# Patient Record
Sex: Female | Born: 1975 | Race: White | Hispanic: No | Marital: Married | Smoking: Never smoker
Health system: Southern US, Community
[De-identification: ages and names within clinical notes are randomized; demographics above are authoritative.]

## PROBLEM LIST (undated history)

## (undated) DIAGNOSIS — D649 Anemia, unspecified: Secondary | ICD-10-CM

## (undated) DIAGNOSIS — F53 Postpartum depression: Secondary | ICD-10-CM

## (undated) DIAGNOSIS — Z789 Other specified health status: Secondary | ICD-10-CM

## (undated) DIAGNOSIS — F32A Depression, unspecified: Secondary | ICD-10-CM

## (undated) DIAGNOSIS — R011 Cardiac murmur, unspecified: Secondary | ICD-10-CM

## (undated) DIAGNOSIS — O99345 Other mental disorders complicating the puerperium: Secondary | ICD-10-CM

## (undated) DIAGNOSIS — F329 Major depressive disorder, single episode, unspecified: Secondary | ICD-10-CM

## (undated) HISTORY — DX: Anemia, unspecified: D64.9

## (undated) HISTORY — PX: DILATION AND CURETTAGE OF UTERUS: SHX78

## (undated) HISTORY — PX: MOUTH SURGERY: SHX715

## (undated) HISTORY — DX: Cardiac murmur, unspecified: R01.1

---

## 2003-01-06 ENCOUNTER — Other Ambulatory Visit: Admission: RE | Admit: 2003-01-06 | Discharge: 2003-01-06 | Payer: Self-pay | Admitting: Obstetrics and Gynecology

## 2003-09-07 ENCOUNTER — Encounter: Admission: RE | Admit: 2003-09-07 | Discharge: 2003-09-07 | Payer: Self-pay | Admitting: Internal Medicine

## 2004-01-31 ENCOUNTER — Other Ambulatory Visit: Admission: RE | Admit: 2004-01-31 | Discharge: 2004-01-31 | Payer: Self-pay | Admitting: Obstetrics and Gynecology

## 2005-03-18 ENCOUNTER — Other Ambulatory Visit: Admission: RE | Admit: 2005-03-18 | Discharge: 2005-03-18 | Payer: Self-pay | Admitting: Obstetrics and Gynecology

## 2008-03-25 ENCOUNTER — Ambulatory Visit (HOSPITAL_COMMUNITY): Admission: RE | Admit: 2008-03-25 | Discharge: 2008-03-25 | Payer: Self-pay | Admitting: Obstetrics and Gynecology

## 2008-03-25 ENCOUNTER — Encounter (INDEPENDENT_AMBULATORY_CARE_PROVIDER_SITE_OTHER): Payer: Self-pay | Admitting: Obstetrics and Gynecology

## 2008-06-05 ENCOUNTER — Inpatient Hospital Stay (HOSPITAL_COMMUNITY): Admission: AD | Admit: 2008-06-05 | Discharge: 2008-06-05 | Payer: Self-pay | Admitting: Obstetrics and Gynecology

## 2008-11-07 ENCOUNTER — Ambulatory Visit: Payer: Self-pay | Admitting: Internal Medicine

## 2009-01-10 ENCOUNTER — Ambulatory Visit: Payer: Self-pay | Admitting: Internal Medicine

## 2009-01-30 ENCOUNTER — Ambulatory Visit: Payer: Self-pay | Admitting: Advanced Practice Midwife

## 2009-01-30 ENCOUNTER — Inpatient Hospital Stay (HOSPITAL_COMMUNITY): Admission: AD | Admit: 2009-01-30 | Discharge: 2009-02-01 | Payer: Self-pay | Admitting: Obstetrics and Gynecology

## 2009-02-07 ENCOUNTER — Ambulatory Visit: Admission: RE | Admit: 2009-02-07 | Discharge: 2009-02-07 | Payer: Self-pay | Admitting: Obstetrics and Gynecology

## 2009-11-20 ENCOUNTER — Ambulatory Visit: Payer: Self-pay | Admitting: Internal Medicine

## 2010-10-25 ENCOUNTER — Ambulatory Visit
Admission: RE | Admit: 2010-10-25 | Discharge: 2010-10-25 | Payer: Self-pay | Source: Home / Self Care | Attending: Internal Medicine | Admitting: Internal Medicine

## 2011-02-06 LAB — CBC
HCT: 26.3 % — ABNORMAL LOW (ref 36.0–46.0)
HCT: 37.6 % (ref 36.0–46.0)
Hemoglobin: 12.7 g/dL (ref 12.0–15.0)
Hemoglobin: 9 g/dL — ABNORMAL LOW (ref 12.0–15.0)
MCHC: 33.7 g/dL (ref 30.0–36.0)
MCHC: 34.2 g/dL (ref 30.0–36.0)
MCV: 86.2 fL (ref 78.0–100.0)
MCV: 87.3 fL (ref 78.0–100.0)
Platelets: 177 10*3/uL (ref 150–400)
Platelets: 191 10*3/uL (ref 150–400)
RBC: 3.01 MIL/uL — ABNORMAL LOW (ref 3.87–5.11)
RBC: 4.36 MIL/uL (ref 3.87–5.11)
RDW: 14.2 % (ref 11.5–15.5)
RDW: 14.3 % (ref 11.5–15.5)
WBC: 14.7 10*3/uL — ABNORMAL HIGH (ref 4.0–10.5)
WBC: 16 10*3/uL — ABNORMAL HIGH (ref 4.0–10.5)

## 2011-02-06 LAB — RAPID HIV SCREEN (WH-MAU): Rapid HIV Screen: NONREACTIVE

## 2011-02-06 LAB — RPR: RPR Ser Ql: NONREACTIVE

## 2011-03-12 NOTE — Op Note (Signed)
NAME:  Gloria Howell, Gloria Howell           ACCOUNT NO.:  [id number]   MEDICAL RECORD NO.:  [id number]          PATIENT TYPE:  AMB   LOCATION:  SDC                           FACILITY:  [facility]   PHYSICIAN:  Michelle L. Grewal, M.D.DATE OF BIRTH:  1976-05-28   DATE OF PROCEDURE:  03/25/2008  DATE OF DISCHARGE:                               OPERATIVE REPORT   PREOPERATIVE DIAGNOSIS:  Incomplete intrauterine device.   POSTOPERATIVE DIAGNOSIS:  Incomplete intrauterine device.   PROCEDURE:  Dilatation and evacuation.   SURGEON:  Michelle L. Vincente Poli, MD   ANESTHESIA:  MAC with local.   FINDINGS:  Products of conception.   DISPOSITION:  The specimens to pathology.   ESTIMATED BLOOD LOSS:  Less than 50 mL.   COMPLICATIONS:  None.   PROCEDURE:  The patient was taken to the operating room after informed  consent was obtained.  She was then prepped and draped and in-and-out  catheter was used to empty the bladder.  A speculum was inserted into  the vagina.  The cervix was grasped with a tenaculum, and a paracervical  block was performed.  Of note, there was a moderate amount of blood  noted in the vagina.  The patient had reported, she had started bleeding  the day before.  The cervix was already slightly opened, but I did  dilate a little bit further with a Geneticist, molecular.  A #7 suction cannula  was inserted into the uterus and the uterus was thoroughly suctioned x2  with products of conception obtained.  The cannula was then removed.  A  sharp curette was inserted and the uterus was thoroughly curetted of all  tissue.  A final suction curettage was performed and the uterine cavity  was cleaned.  All instruments were removed from the vagina.  All sponge,  lap, and instrument counts were correct x2.  The patient went to the  recovery room in stable condition.      Michelle L. Vincente Poli, M.D.  Electronically Signed     MLG/MEDQ  D:  03/25/2008  T:  03/25/2008  Job:  [id number]

## 2011-07-24 LAB — CBC
HCT: 36.6
Hemoglobin: 12.7
MCHC: 34.8
MCV: 85.9
Platelets: 265
RBC: 4.26
RDW: 12.6
WBC: 7.8

## 2011-07-24 LAB — ABO/RH: ABO/RH(D): A POS

## 2012-05-07 LAB — OB RESULTS CONSOLE HIV ANTIBODY (ROUTINE TESTING): HIV: NONREACTIVE

## 2012-05-07 LAB — OB RESULTS CONSOLE RPR: RPR: NONREACTIVE

## 2012-05-07 LAB — OB RESULTS CONSOLE RUBELLA ANTIBODY, IGM: Rubella: IMMUNE

## 2012-05-07 LAB — OB RESULTS CONSOLE HEPATITIS B SURFACE ANTIGEN: Hepatitis B Surface Ag: NEGATIVE

## 2012-05-07 LAB — OB RESULTS CONSOLE ANTIBODY SCREEN: Antibody Screen: NEGATIVE

## 2012-05-21 ENCOUNTER — Other Ambulatory Visit: Payer: Self-pay | Admitting: Obstetrics and Gynecology

## 2012-05-21 LAB — OB RESULTS CONSOLE GC/CHLAMYDIA
Chlamydia: NEGATIVE
Gonorrhea: NEGATIVE

## 2012-10-28 NOTE — L&D Delivery Note (Signed)
Operative Delivery Note At 6:45 PM a viable female was delivered via Vaginal, Vacuum Investment banker, operational).  Presentation: vertex; Position: Right,, Occiput,, Anterior; Station: +3.  Verbal consent: obtained from patient.  Risks and benefits discussed in detail.  Risks include, but are not limited to the risks of anesthesia, bleeding, infection, damage to maternal tissues, fetal cephalhematoma.  There is also the risk of inability to effect vaginal delivery of the head, or shoulder dystocia that cannot be resolved by established maneuvers, leading to the need for emergency cesarean section. Mild shoulder dystocia noted relieved with McRoberts maneuver APGAR: 9 9 weight pending   Placenta status: Intact, Spontaneous.   Cord: 3 vessels with the following complications: nuchal x 1  Cord pH: not obtained  Anesthesia: Epidural  Instruments: mushroom Episiotomy: none Lacerations: first  Suture Repair: 3.0 chromic Est. Blood Loss (mL): 300  Mom to postpartum.  Baby to nursery-stable.  Chiquita Heckert L 12/04/2012, 6:55 PM

## 2012-11-19 LAB — OB RESULTS CONSOLE GBS: GBS: NEGATIVE

## 2012-12-04 ENCOUNTER — Inpatient Hospital Stay (HOSPITAL_COMMUNITY)
Admission: AD | Admit: 2012-12-04 | Discharge: 2012-12-07 | DRG: 373 | Disposition: A | Discharge status: Home / Self Care | Payer: BC Managed Care – PPO | Source: Ambulatory Visit | Attending: Obstetrics and Gynecology | Admitting: Obstetrics and Gynecology

## 2012-12-04 ENCOUNTER — Encounter (HOSPITAL_COMMUNITY): Payer: Self-pay | Admitting: Anesthesiology

## 2012-12-04 ENCOUNTER — Encounter (HOSPITAL_COMMUNITY): Payer: Self-pay | Admitting: *Deleted

## 2012-12-04 ENCOUNTER — Inpatient Hospital Stay (HOSPITAL_COMMUNITY): Payer: BC Managed Care – PPO | Admitting: Anesthesiology

## 2012-12-04 DIAGNOSIS — O09529 Supervision of elderly multigravida, unspecified trimester: Secondary | ICD-10-CM | POA: Diagnosis present

## 2012-12-04 HISTORY — DX: Major depressive disorder, single episode, unspecified: F32.9

## 2012-12-04 HISTORY — DX: Other specified health status: Z78.9

## 2012-12-04 HISTORY — DX: Postpartum depression: F53.0

## 2012-12-04 HISTORY — DX: Depression, unspecified: F32.A

## 2012-12-04 HISTORY — DX: Other mental disorders complicating the puerperium: O99.345

## 2012-12-04 LAB — CBC
HCT: 37.3 % (ref 36.0–46.0)
Hemoglobin: 12.6 g/dL (ref 12.0–15.0)
MCH: 28 pg (ref 26.0–34.0)
MCHC: 33.8 g/dL (ref 30.0–36.0)
MCV: 82.9 fL (ref 78.0–100.0)
Platelets: 224 10*3/uL (ref 150–400)
RBC: 4.5 MIL/uL (ref 3.87–5.11)
RDW: 16 % — ABNORMAL HIGH (ref 11.5–15.5)
WBC: 11.5 10*3/uL — ABNORMAL HIGH (ref 4.0–10.5)

## 2012-12-04 LAB — RPR: RPR Ser Ql: NONREACTIVE

## 2012-12-04 MED ORDER — LANOLIN HYDROUS EX OINT: TOPICAL_OINTMENT | CUTANEOUS | Status: DC | PRN

## 2012-12-04 MED ORDER — LACTATED RINGERS IV SOLN
INTRAVENOUS | Status: DC
  Administered 2012-12-04 (×2): 125 mL/h via INTRAVENOUS

## 2012-12-04 MED ORDER — BISACODYL 10 MG RE SUPP: 10.0000 mg | Freq: Every day | RECTAL | Status: DC | PRN

## 2012-12-04 MED ORDER — OXYCODONE-ACETAMINOPHEN 5-325 MG PO TABS: 1.0000 | ORAL_TABLET | ORAL | Status: DC | PRN

## 2012-12-04 MED ORDER — LIDOCAINE HCL (PF) 1 % IJ SOLN
30.0000 mL | INTRAMUSCULAR | Status: DC | PRN
  Administered 2012-12-04: 30 mL via SUBCUTANEOUS
  Filled 2012-12-04: qty 30

## 2012-12-04 MED ORDER — FENTANYL 2.5 MCG/ML BUPIVACAINE 1/10 % EPIDURAL INFUSION (WH - ANES)
INTRAMUSCULAR | Status: DC | PRN
  Administered 2012-12-04: 14 mL/h via EPIDURAL

## 2012-12-04 MED ORDER — MEDROXYPROGESTERONE ACETATE 150 MG/ML IM SUSP: 150.0000 mg | INTRAMUSCULAR | Status: DC | PRN

## 2012-12-04 MED ORDER — ONDANSETRON HCL 4 MG/2ML IJ SOLN: 4.0000 mg | INTRAMUSCULAR | Status: DC | PRN

## 2012-12-04 MED ORDER — ACETAMINOPHEN 325 MG PO TABS: 650.0000 mg | ORAL_TABLET | ORAL | Status: DC | PRN

## 2012-12-04 MED ORDER — DIPHENHYDRAMINE HCL 50 MG/ML IJ SOLN: 12.5000 mg | INTRAMUSCULAR | Status: DC | PRN

## 2012-12-04 MED ORDER — FENTANYL 2.5 MCG/ML BUPIVACAINE 1/10 % EPIDURAL INFUSION (WH - ANES)
14.0000 mL/h | INTRAMUSCULAR | Status: DC
  Filled 2012-12-04: qty 125

## 2012-12-04 MED ORDER — OXYTOCIN 40 UNITS IN LACTATED RINGERS INFUSION - SIMPLE MED
62.5000 mL/h | INTRAVENOUS | Status: DC
  Filled 2012-12-04: qty 1000

## 2012-12-04 MED ORDER — PHENYLEPHRINE 40 MCG/ML (10ML) SYRINGE FOR IV PUSH (FOR BLOOD PRESSURE SUPPORT)
80.0000 ug | PREFILLED_SYRINGE | INTRAVENOUS | Status: DC | PRN
  Filled 2012-12-04: qty 5

## 2012-12-04 MED ORDER — ONDANSETRON HCL 4 MG PO TABS: 4.0000 mg | ORAL_TABLET | ORAL | Status: DC | PRN

## 2012-12-04 MED ORDER — FLEET ENEMA 7-19 GM/118ML RE ENEM: 1.0000 | ENEMA | RECTAL | Status: DC | PRN

## 2012-12-04 MED ORDER — OXYCODONE-ACETAMINOPHEN 5-325 MG PO TABS
1.0000 | ORAL_TABLET | ORAL | Status: DC | PRN
  Administered 2012-12-04: 1 via ORAL
  Filled 2012-12-04: qty 1

## 2012-12-04 MED ORDER — DIPHENHYDRAMINE HCL 25 MG PO CAPS: 25.0000 mg | ORAL_CAPSULE | Freq: Four times a day (QID) | ORAL | Status: DC | PRN

## 2012-12-04 MED ORDER — OXYTOCIN BOLUS FROM INFUSION
500.0000 mL | INTRAVENOUS | Status: DC
  Administered 2012-12-04: 500 mL via INTRAVENOUS

## 2012-12-04 MED ORDER — LACTATED RINGERS IV SOLN: 500.0000 mL | Freq: Once | INTRAVENOUS | Status: DC

## 2012-12-04 MED ORDER — IBUPROFEN 600 MG PO TABS
600.0000 mg | ORAL_TABLET | Freq: Four times a day (QID) | ORAL | Status: DC | PRN
  Administered 2012-12-04: 600 mg via ORAL
  Filled 2012-12-04: qty 1

## 2012-12-04 MED ORDER — EPHEDRINE 5 MG/ML INJ: 10.0000 mg | INTRAVENOUS | Status: DC | PRN

## 2012-12-04 MED ORDER — LIDOCAINE HCL (PF) 1 % IJ SOLN
INTRAMUSCULAR | Status: DC | PRN
  Administered 2012-12-04 (×2): 4 mL

## 2012-12-04 MED ORDER — IBUPROFEN 600 MG PO TABS
600.0000 mg | ORAL_TABLET | Freq: Four times a day (QID) | ORAL | Status: DC
  Administered 2012-12-05 – 2012-12-06 (×7): 600 mg via ORAL
  Filled 2012-12-04 (×9): qty 1

## 2012-12-04 MED ORDER — ZOLPIDEM TARTRATE 5 MG PO TABS: 5.0000 mg | ORAL_TABLET | Freq: Every evening | ORAL | Status: DC | PRN

## 2012-12-04 MED ORDER — SIMETHICONE 80 MG PO CHEW: 80.0000 mg | CHEWABLE_TABLET | ORAL | Status: DC | PRN

## 2012-12-04 MED ORDER — TETANUS-DIPHTH-ACELL PERTUSSIS 5-2.5-18.5 LF-MCG/0.5 IM SUSP: 0.5000 mL | Freq: Once | INTRAMUSCULAR | Status: DC

## 2012-12-04 MED ORDER — BENZOCAINE-MENTHOL 20-0.5 % EX AERO
1.0000 | INHALATION_SPRAY | CUTANEOUS | Status: DC | PRN
Start: 2012-12-04 — End: 2012-12-07
  Administered 2012-12-04: 1 via TOPICAL
  Filled 2012-12-04: qty 56

## 2012-12-04 MED ORDER — LACTATED RINGERS IV SOLN
500.0000 mL | INTRAVENOUS | Status: DC | PRN
  Administered 2012-12-04: 1000 mL via INTRAVENOUS

## 2012-12-04 MED ORDER — ONDANSETRON HCL 4 MG/2ML IJ SOLN: 4.0000 mg | Freq: Four times a day (QID) | INTRAMUSCULAR | Status: DC | PRN

## 2012-12-04 MED ORDER — DIBUCAINE 1 % RE OINT: 1.0000 "application " | TOPICAL_OINTMENT | RECTAL | Status: DC | PRN

## 2012-12-04 MED ORDER — WITCH HAZEL-GLYCERIN EX PADS
1.0000 | MEDICATED_PAD | CUTANEOUS | Status: DC | PRN
Start: 2012-12-04 — End: 2012-12-07

## 2012-12-04 MED ORDER — PRENATAL MULTIVITAMIN CH
1.0000 | ORAL_TABLET | Freq: Every day | ORAL | Status: DC
  Administered 2012-12-05 – 2012-12-07 (×2): 1 via ORAL
  Filled 2012-12-04 (×2): qty 1

## 2012-12-04 MED ORDER — MEASLES, MUMPS & RUBELLA VAC ~~LOC~~ INJ: 0.5000 mL | INJECTION | Freq: Once | SUBCUTANEOUS | Status: DC

## 2012-12-04 MED ORDER — CITRIC ACID-SODIUM CITRATE 334-500 MG/5ML PO SOLN: 30.0000 mL | ORAL | Status: DC | PRN

## 2012-12-04 MED ORDER — FLEET ENEMA 7-19 GM/118ML RE ENEM: 1.0000 | ENEMA | Freq: Every day | RECTAL | Status: DC | PRN

## 2012-12-04 MED ORDER — EPHEDRINE 5 MG/ML INJ
10.0000 mg | INTRAVENOUS | Status: DC | PRN
  Filled 2012-12-04: qty 4

## 2012-12-04 MED ORDER — PHENYLEPHRINE 40 MCG/ML (10ML) SYRINGE FOR IV PUSH (FOR BLOOD PRESSURE SUPPORT): 80.0000 ug | PREFILLED_SYRINGE | INTRAVENOUS | Status: DC | PRN

## 2012-12-04 MED ORDER — SENNOSIDES-DOCUSATE SODIUM 8.6-50 MG PO TABS
2.0000 | ORAL_TABLET | Freq: Every day | ORAL | Status: DC
  Administered 2012-12-04 – 2012-12-05 (×2): 2 via ORAL

## 2012-12-04 NOTE — MAU Note (Signed)
Stripped her membranes this morning when checked in office this morning.  3-4/90 when checked.  Was told to come in and they would break her water and get things going.  Denies any problems with pregnancy.   Not really in any pain.

## 2012-12-04 NOTE — Anesthesia Procedure Notes (Signed)
Epidural Patient location during procedure: OB Start time: 12/04/2012 3:28 PM  Staffing Anesthesiologist: Falisha Osment A. Performed by: anesthesiologist   Preanesthetic Checklist Completed: patient identified, site marked, surgical consent, pre-op evaluation, timeout performed, IV checked, risks and benefits discussed and monitors and equipment checked  Epidural Patient position: sitting Prep: site prepped and draped and DuraPrep Patient monitoring: continuous pulse ox and blood pressure Approach: midline Injection technique: LOR air  Needle:  Needle type: Tuohy  Needle gauge: 17 G Needle length: 9 cm and 9 Needle insertion depth: 5 cm cm Catheter type: closed end flexible Catheter size: 19 Gauge Catheter at skin depth: 10 cm Test dose: negative and Other  Assessment Events: blood not aspirated, injection not painful, no injection resistance, negative IV test and no paresthesia  Additional Notes Patient identified. Risks and benefits discussed including failed block, incomplete  Pain control, post dural puncture headache, nerve damage, paralysis, blood pressure Changes, nausea, vomiting, reactions to medications-both toxic and allergic and post Partum back pain. All questions were answered. Patient expressed understanding and wished to proceed. Sterile technique was used throughout procedure. Epidural site was Dressed with sterile barrier dressing. No paresthesias, signs of intravascular injection Or signs of intrathecal spread were encountered.  Patient was more comfortable after the epidural was dosed. Please see RN's note for documentation of vital signs and FHR which are stable.

## 2012-12-04 NOTE — Progress Notes (Signed)
Dr. Vincente Poli informed of FHR decel down to 60-65, entire decel last 5 minutes, FHR now back up to 130, also informed of interventions & that pt is going to room 160.

## 2012-12-04 NOTE — Progress Notes (Signed)
Informed MD of pt's arrival, states pt to be admitted to L&D.

## 2012-12-04 NOTE — Anesthesia Preprocedure Evaluation (Signed)

## 2012-12-04 NOTE — H&P (Signed)
37 year old G 3 P0111 at 35 w 3 days presented this am to the office in labor 3 to 4 cm dilated. She was then instructed to come to [facility]. Since admission, she was given epidural and now cervix is 9 cm. PNC see [place] History of NSVD Her son has Angelman syndrome.  Afebrile vss FHR Category 1 General alert and oriented Lung CTAB Car rrr Abdomen is soft and non tender Cervix is 100%/9 cm 0  Vertex  AROM Clear fluid  IMPRESSION: IUP at 38 w 3 days Labor  PLAN: Anticipate NSVD

## 2012-12-05 LAB — CBC
HCT: 32 % — ABNORMAL LOW (ref 36.0–46.0)
Hemoglobin: 10.6 g/dL — ABNORMAL LOW (ref 12.0–15.0)
MCH: 27.5 pg (ref 26.0–34.0)
MCHC: 33.1 g/dL (ref 30.0–36.0)
MCV: 83.1 fL (ref 78.0–100.0)
Platelets: 206 10*3/uL (ref 150–400)
RBC: 3.85 MIL/uL — ABNORMAL LOW (ref 3.87–5.11)
RDW: 16.1 % — ABNORMAL HIGH (ref 11.5–15.5)
WBC: 15.2 10*3/uL — ABNORMAL HIGH (ref 4.0–10.5)

## 2012-12-05 MED FILL — Ibuprofen Tab 600 MG: ORAL | Qty: 1 | Status: AC

## 2012-12-05 NOTE — Anesthesia Postprocedure Evaluation (Signed)
Anesthesia Post Note  Patient: Gloria Howell  Procedure(s) Performed: * No procedures listed *  Anesthesia type: Epidural  Patient location: Mother/Baby  Post pain: Pain level controlled  Post assessment: Post-op Vital signs reviewed  Last Vitals:  Filed Vitals:   12/05/12 0540  BP: 117/75  Pulse: 97  Temp: 36.7 C  Resp: 18    Post vital signs: Reviewed  Level of consciousness:alert  Complications: No apparent anesthesia complications

## 2012-12-05 NOTE — Progress Notes (Signed)
Post Partum Day 1 Subjective: no complaints, up ad lib, voiding, tolerating PO and + flatus  Objective: Blood pressure 108/57, pulse 91, temperature 98 F (36.7 C), temperature source Oral, resp. rate 16, height 5\' 3"  (1.6 m), weight 75.297 kg (166 lb), SpO2 99.00%, unknown if currently breastfeeding.  Physical Exam:  General: alert, cooperative and appears stated age Lochia: appropriate Uterine Fundus: firm Incision: healing well DVT Evaluation: No evidence of DVT seen on physical exam.   Recent Labs  12/04/12 1226 12/05/12 0536  HGB 12.6 10.6*  HCT 37.3 32.0*    Assessment/Plan: Plan for discharge tomorrow   LOS: 1 day   Gloria Howell 12/05/2012, 9:22 AM

## 2012-12-06 MED ORDER — LORAZEPAM 1 MG PO TABS
1.0000 mg | ORAL_TABLET | ORAL | Status: DC | PRN
  Administered 2012-12-06 – 2012-12-07 (×3): 1 mg via ORAL
  Filled 2012-12-06 (×3): qty 1

## 2012-12-06 MED ORDER — IBUPROFEN 600 MG PO TABS: 600.0000 mg | ORAL_TABLET | Freq: Four times a day (QID) | ORAL | Status: DC

## 2012-12-06 NOTE — Discharge Summary (Signed)
Obstetric Discharge Summary Reason for Admission: onset of labor Prenatal Procedures: none Intrapartum Procedures: vacuum Postpartum Procedures: none Complications-Operative and Postpartum: none Hemoglobin  Date Value Range Status  12/05/2012 10.6* 12.0 - 15.0 g/dL Final     HCT  Date Value Range Status  12/05/2012 32.0* 36.0 - 46.0 % Final    Physical Exam:  General: alert, cooperative and appears stated age 37: appropriate Uterine Fundus: firm Incision: healing well DVT Evaluation: No evidence of DVT seen on physical exam.  Discharge Diagnoses: Term Pregnancy-delivered  Discharge Information: Date: 12/06/2012 Activity: pelvic rest Diet: routine Medications: Ibuprofen Condition: improved Instructions: refer to practice specific booklet Discharge to: home   Newborn Data: Live born female  Birth Weight: 6 lb 13 oz (3090 g) APGAR: 9, 9  Home with nursery.  Gloria Howell 12/06/2012, 8:18 AM

## 2012-12-06 NOTE — Progress Notes (Signed)
Called by NICU and the nurse Baby had several apneic episodes and Code APGAR called Baby in NICU  Will transfer patient to 3rd floor tonight Ativan prn anxiety

## 2012-12-06 NOTE — Plan of Care (Signed)
Problem: Discharge Progression Outcomes Goal: Complications resolved/controlled Outcome: Adequate for Discharge Infant will become baby patient to further assess nutrition needs, jaundice, and growth.

## 2012-12-06 NOTE — Clinical Social Work Maternal (Signed)
    Clinical Social Work Department PSYCHOSOCIAL ASSESSMENT - MATERNAL/CHILD 12/06/2012  Patient:  Gloria Howell, Gloria Howell  Account Number:  [id number]  Admit Date:  12/04/2012  Marjo Bicker Name:   Rulon Abide    Clinical Social Worker:  Lulu Riding, LCSW   Date/Time:  12/06/2012 10:00 AM  Date Referred:  12/06/2012   Referral source  CN     Referred reason  Behavioral Health Issues   Other referral source:    I:  FAMILY / HOME ENVIRONMENT Child's legal guardian:  PARENT  Guardian - Name Guardian - Age Guardian - Address  Laylah Devivo [address], [place], [place] [address]  Moises Blood  same   Other household support members/support persons Name Relationship DOB  Carter Neal SON 3.5   Other support:   Parents report having a good support system.  They state MGM is caring for their son while they are in the hospital.    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 Avis Epley.   IV  RISK FACTORS AND CURRENT PROBLEMS Current Problem:  None     V  SOCIAL WORK ASSESSMENT CSW met with MOB in her first floor room/142 to complete assessment for hx of PPD.  FOB was present and supportive. Both parents were engaged in the conversation.  They report doing well at this time and having good supports.  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 She states she took Zoloft for a period of time and that it was helpful.  Parents discussed their son's dx of Angelman's Syndrome.  MOB states he was not a good sleeper and she was exhausted.  CSW explained that PPD is common and normal and that talking to her OB was the right thing to do.  She states she has already had a discussion with her OB and knows signs and symptoms to watch for.  Parents state no questions or needs at this time and seemed appreciative of CSW's visit.  CSW has no social concerns at this time.      VI SOCIAL WORK PLAN Social Work Plan  No Further Intervention Required / No Barriers to Discharge   Type of pt/family education:   PPD   If child protective services report - county:   If child protective services report - date:   Information/referral to community resources comment:   No referral needs identified at this time.   Other social work plan:

## 2012-12-06 NOTE — Progress Notes (Signed)
Post Partum Day 2 Subjective: no complaints, up ad lib, voiding and tolerating PO  Objective: Blood pressure 113/74, pulse 84, temperature 97.7 F (36.5 C), temperature source Oral, resp. rate 18, height 5\' 3"  (1.6 m), weight 75.297 kg (166 lb), SpO2 96.00%, unknown if currently breastfeeding.  Physical Exam:  General: alert, cooperative and appears stated age Lochia: appropriate Uterine Fundus: firm Incision: healing well, no significant drainage DVT Evaluation: No evidence of DVT seen on physical exam.   Recent Labs  12/04/12 1226 12/05/12 0536  HGB 12.6 10.6*  HCT 37.3 32.0*    Assessment/Plan: Discharge home and Breastfeeding   LOS: 2 days   Kimari Coudriet L 12/06/2012, 8:17 AM

## 2012-12-07 NOTE — Progress Notes (Signed)
12/07/12 1400  Clinical Encounter Type  Visited With Patient and family together  Visit Type Follow-up   Attempted visit to offer spiritual and emotional support, after colleague Orpha Bur Claussen's initial visit took place at a time inconvenient for family.   Family appreciative of caring outreach.  Gloria Howell was tearful and facing away from the door; I let family know gently that spiritual care is available as desired.  Will follow family in the NICU.  Please page [phone] as chaplain support needed/desired.  Thank you!  [address] [place], [place] [phone]

## 2012-12-07 NOTE — Progress Notes (Signed)
I introduced myself and offered initial support to family.  They did not wish to talk at this time, however they are aware of on-going availability of chaplain support.  We will continue to follow this family in the NICU as we see them, but please also page as needs arise.    [employer] Pager, [phone] 10:37 AM   12/07/12 1000  Clinical Encounter Type  Visited With Patient and family together  Visit Type Initial  Referral From Care management  Stress Factors  Patient Stress Factors (Baby in NICU, unexpected.)

## 2012-12-07 NOTE — Progress Notes (Signed)
Patient has been cooperative, but very tearful and anxious over infants condition. Ativan ordered and given PRN. Spouse remains at bedside. Patient up most of this shift with small intervals of sleeping. Will continue to monitor for changes.

## 2012-12-07 NOTE — Discharge Summary (Signed)
Obstetric Discharge Summary Reason for Admission: onset of labor Prenatal Procedures: none Intrapartum Procedures: vacuum Postpartum Procedures: none Complications-Operative and Postpartum: first degree perineal laceration Hemoglobin  Date Value Range Status  12/05/2012 10.6* 12.0 - 15.0 g/dL Final     HCT  Date Value Range Status  12/05/2012 32.0* 36.0 - 46.0 % Final    Physical Exam:  General: alert Lochia: appropriate Uterine Fundus: firm Incision: healing well DVT Evaluation: No evidence of DVT seen on physical exam.  Discharge Diagnoses: Term Pregnancy-delivered  Discharge Information: Date: 12/07/2012 Activity: pelvic rest Diet: routine Medications: PNV and Percocet Condition: stable Instructions: refer to practice specific booklet Discharge to: home   Newborn Data: Live born female  Birth Weight: 6 lb 13 oz (3090 g) APGAR: 9, 9  Home with in nicu after seizures.  Gloria Howell S 12/07/2012, 8:37 AM

## 2013-01-18 ENCOUNTER — Other Ambulatory Visit: Payer: Self-pay | Admitting: Obstetrics and Gynecology

## 2013-11-29 ENCOUNTER — Ambulatory Visit (INDEPENDENT_AMBULATORY_CARE_PROVIDER_SITE_OTHER): Payer: BC Managed Care – PPO | Admitting: Internal Medicine

## 2013-11-29 ENCOUNTER — Encounter: Payer: Self-pay | Admitting: Internal Medicine

## 2013-11-29 VITALS — BP 128/86 | HR 80 | Temp 99.0°F | Ht 63.75 in | Wt 145.0 lb

## 2013-11-29 DIAGNOSIS — H6690 Otitis media, unspecified, unspecified ear: Secondary | ICD-10-CM

## 2013-11-29 DIAGNOSIS — J029 Acute pharyngitis, unspecified: Secondary | ICD-10-CM

## 2013-11-29 DIAGNOSIS — H6692 Otitis media, unspecified, left ear: Secondary | ICD-10-CM

## 2013-11-29 LAB — POCT RAPID STREP A (OFFICE): Rapid Strep A Screen: NEGATIVE

## 2013-11-29 MED ORDER — AMOXICILLIN-POT CLAVULANATE 875-125 MG PO TABS: 1.0000 | ORAL_TABLET | Freq: Two times a day (BID) | ORAL | Status: DC
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
## 2013-11-29 NOTE — Patient Instructions (Signed)
Take Augmentin 875 mg twice daily for 10 days. Take ibuprofen for pain.

## 2013-12-06 ENCOUNTER — Ambulatory Visit (INDEPENDENT_AMBULATORY_CARE_PROVIDER_SITE_OTHER): Payer: BC Managed Care – PPO | Admitting: Internal Medicine

## 2013-12-06 ENCOUNTER — Telehealth: Payer: Self-pay

## 2013-12-06 ENCOUNTER — Encounter: Payer: Self-pay | Admitting: Internal Medicine

## 2013-12-06 VITALS — BP 134/80 | HR 80 | Temp 99.3°F | Wt 145.0 lb

## 2013-12-06 DIAGNOSIS — H6692 Otitis media, unspecified, left ear: Secondary | ICD-10-CM

## 2013-12-06 DIAGNOSIS — H6690 Otitis media, unspecified, unspecified ear: Secondary | ICD-10-CM

## 2013-12-06 MED ORDER — LEVOFLOXACIN 500 MG PO TABS: 500.0000 mg | ORAL_TABLET | Freq: Every day | ORAL | Status: DC

## 2013-12-06 NOTE — Telephone Encounter (Signed)
Needs Office visit

## 2013-12-06 NOTE — Progress Notes (Signed)
   Subjective:    Patient ID: Gloria Howell, female    DOB: 1976/07/25, 38 y.o.   MRN: [id number]  HPI Patient was seen February 2 for acute left otitis media. Was treated with Augmentin for 10 days. Call last week after a few days saying she was no better. Told her to come in today because she still complaining of decreased hearing and discomfort in left ear. Not hearing well due to ear congestion. Has not been taking decongestant.    Review of Systems     Objective:   Physical Exam Right TM is slightly full but not red. Left TM is fire red dull and retracted       Assessment & Plan:  Acute left otitis media  Plan: Change to Levaquin 500 milligrams daily for 10 days. Take over-the-counter decongestant such as DayQuil.

## 2013-12-06 NOTE — Patient Instructions (Signed)
Discontinue Augmentin and start Levaquin 500 milligrams daily for 10 days. Take DayQuil for ear congestion

## 2013-12-06 NOTE — Telephone Encounter (Signed)
Still having a problem with her left ear. Cant hear out of it.

## 2013-12-06 NOTE — Telephone Encounter (Signed)
Coming today at 4:15pm

## 2014-01-25 ENCOUNTER — Ambulatory Visit (INDEPENDENT_AMBULATORY_CARE_PROVIDER_SITE_OTHER): Payer: BC Managed Care – PPO | Admitting: Internal Medicine

## 2014-01-25 ENCOUNTER — Encounter: Payer: Self-pay | Admitting: Internal Medicine

## 2014-01-25 VITALS — BP 108/80 | Temp 98.9°F | Wt 144.0 lb

## 2014-01-25 DIAGNOSIS — H6692 Otitis media, unspecified, left ear: Secondary | ICD-10-CM

## 2014-01-25 DIAGNOSIS — H6690 Otitis media, unspecified, unspecified ear: Secondary | ICD-10-CM

## 2014-01-25 MED ORDER — LEVOFLOXACIN 500 MG PO TABS: 500.0000 mg | ORAL_TABLET | Freq: Every day | ORAL | Status: DC

## 2014-01-25 NOTE — Patient Instructions (Addendum)
Take Levaquin 500 mg daily x 10 days for ear infection.

## 2014-01-25 NOTE — Progress Notes (Signed)
   Subjective:    Patient ID: Gloria Howell, female    DOB: April 17, 1976, 38 y.o.   MRN: [id number]  HPI Onset malaise and myalgias Friday afternoon. Son has had similar illness with BOM. Has had cough and congestion. Had fever and chills. Had sore throat. A lot of discolored nasal drainage.    Review of Systems     Objective:   Physical Exam Left TM fire red.  Right TM full but not red. Pharynx slightly injected. Neck supple. Chest clear to auscultation. Rapid strep screen is negative.       Assessment & Plan:  LOM Acute URI  Plan: Pt had similar illness in early Feb that did not respond to Augmentin but did respond to levaquin. Treat to day with levaquin 500 mg daily x 10 days.

## 2014-03-18 ENCOUNTER — Other Ambulatory Visit: Payer: Self-pay | Admitting: Obstetrics and Gynecology

## 2014-06-21 ENCOUNTER — Ambulatory Visit (INDEPENDENT_AMBULATORY_CARE_PROVIDER_SITE_OTHER): Payer: BC Managed Care – PPO | Admitting: Internal Medicine

## 2014-06-21 ENCOUNTER — Ambulatory Visit
Admission: RE | Admit: 2014-06-21 | Discharge: 2014-06-21 | Disposition: A | Discharge status: Home / Self Care | Payer: BC Managed Care – PPO | Source: Ambulatory Visit | Attending: Internal Medicine | Admitting: Internal Medicine

## 2014-06-21 ENCOUNTER — Encounter: Payer: Self-pay | Admitting: Internal Medicine

## 2014-06-21 VITALS — BP 120/88 | HR 80 | Temp 98.8°F | Wt 143.0 lb

## 2014-06-21 DIAGNOSIS — R05 Cough: Secondary | ICD-10-CM

## 2014-06-21 DIAGNOSIS — J209 Acute bronchitis, unspecified: Secondary | ICD-10-CM

## 2014-06-21 DIAGNOSIS — R059 Cough, unspecified: Secondary | ICD-10-CM

## 2014-06-21 DIAGNOSIS — D179 Benign lipomatous neoplasm, unspecified: Secondary | ICD-10-CM

## 2014-06-21 DIAGNOSIS — J208 Acute bronchitis due to other specified organisms: Secondary | ICD-10-CM

## 2014-06-21 MED ORDER — LEVOFLOXACIN 500 MG PO TABS: 500.0000 mg | ORAL_TABLET | Freq: Every day | ORAL | Status: DC

## 2014-06-21 MED ORDER — HYDROCODONE-HOMATROPINE 5-1.5 MG/5ML PO SYRP: 5.0000 mL | ORAL_SOLUTION | Freq: Three times a day (TID) | ORAL | Status: DC | PRN

## 2014-06-21 NOTE — Progress Notes (Signed)
Patient informed. 
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
## 2014-06-21 NOTE — Patient Instructions (Addendum)
Take Levaquin 500 mg daily x 10 days. Take cough syrup. Have CXR.

## 2014-08-29 ENCOUNTER — Encounter: Payer: Self-pay | Admitting: Internal Medicine

## 2015-03-28 ENCOUNTER — Encounter: Payer: Self-pay | Admitting: Internal Medicine

## 2015-03-28 ENCOUNTER — Ambulatory Visit (INDEPENDENT_AMBULATORY_CARE_PROVIDER_SITE_OTHER): Payer: BLUE CROSS/BLUE SHIELD | Admitting: Internal Medicine

## 2015-03-28 VITALS — BP 118/82 | HR 72 | Temp 98.0°F | Wt 143.0 lb

## 2015-03-28 DIAGNOSIS — K5792 Diverticulitis of intestine, part unspecified, without perforation or abscess without bleeding: Secondary | ICD-10-CM | POA: Diagnosis not present

## 2015-03-28 MED ORDER — METRONIDAZOLE 500 MG PO TABS: 500.0000 mg | ORAL_TABLET | Freq: Two times a day (BID) | ORAL | Status: DC

## 2015-03-28 MED ORDER — METRONIDAZOLE 500 MG PO TABS: 500.0000 mg | ORAL_TABLET | Freq: Three times a day (TID) | ORAL | Status: DC

## 2015-03-28 MED ORDER — CIPROFLOXACIN HCL 500 MG PO TABS: 500.0000 mg | ORAL_TABLET | Freq: Two times a day (BID) | ORAL | Status: DC

## 2015-03-28 NOTE — Patient Instructions (Addendum)
Advance diet slowly as directed. Call if pain recurs. Take antibiotics directed

## 2015-03-28 NOTE — Progress Notes (Signed)
   Subjective:    Patient ID: Gloria Howell, female    DOB: 1976-10-14, 39 y.o.   MRN: [id number]  HPI  Patient developed lower of the oral cramping in the afternoon Tuesday, May 24 while playing with children. Pain persisted and got worse overnight with significant pain in left lower quadrant. She went to an urgent care on Wednesday, May 25. A CT scan was recommended which cannot be done 12 May 26. White blood cell count was 11,800. Complete metabolic panel was essentially normal except for mild elevation of SGPT of 45. She had taken some over-the-counter pain reliever for abdominal pain. She had no fever or shaking chills. No nausea and vomiting. Just pain localized to the left lower quadrant.  CT scan performed Thursday, May 26 raised possibility of diverticulitis. By Friday she was feeling better. Was recommended to go on clear liquid diet.  Yesterday she ate chicken and rice and has had a lot of borborygmi today. However pain has gotten better.  Says she has a lot of borborygmi  from time to time even prior to having the associated abdominal pain. Never had an attack of diverticulitis. No history of colitis.    Review of Systems     Objective:   Physical Exam  Abdomen seems to be very slightly distended with no hepatosplenomegaly or masses appreciated. There are increased bowel sounds in all 4 quadrants. No significant tenderness and definitely no rebound tenderness.      Assessment & Plan:  Abdominal CT report raises possibility of focal colitis or diverticulitis. I believe she's had acute diverticulitis. I'm treating her with Cipro 500 mg twice daily for 7 days and Flagyl 500 mg twice daily for 7 days. Maintain clear liquid diet for an entire week and then advance to soft diet for 5-7 days and then return to regular diet if tolerated. She is to call if pain recurs

## 2015-03-30 ENCOUNTER — Ambulatory Visit: Payer: Self-pay | Admitting: Internal Medicine

## 2015-04-25 ENCOUNTER — Other Ambulatory Visit: Payer: Self-pay | Admitting: Obstetrics and Gynecology

## 2015-04-26 LAB — CYTOLOGY - PAP

## 2016-02-16 ENCOUNTER — Ambulatory Visit (INDEPENDENT_AMBULATORY_CARE_PROVIDER_SITE_OTHER): Payer: BLUE CROSS/BLUE SHIELD | Admitting: Physician Assistant

## 2016-02-16 VITALS — BP 120/72 | HR 95 | Temp 98.0°F | Resp 16 | Ht 63.75 in | Wt 143.0 lb

## 2016-02-16 DIAGNOSIS — S0101XA Laceration without foreign body of scalp, initial encounter: Secondary | ICD-10-CM | POA: Diagnosis not present

## 2016-02-16 DIAGNOSIS — Z23 Encounter for immunization: Secondary | ICD-10-CM | POA: Diagnosis not present
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
## 2016-02-16 NOTE — Patient Instructions (Addendum)
WOUND CARE Please return in 6-7 days to have your stitches/staples removed or sooner if you have concerns. Marland Kitchen Keep area clean and dry for 24 hours. Do not remove bandage, if applied. . After 24 hours, remove bandage and wash wound gently with mild soap and warm water. Reapply a new bandage after cleaning wound, if directed. . Continue daily cleansing with soap and water until stitches/staples are removed. . Do not apply any ointments or creams to the wound while stitches/staples are in place, as this may cause delayed healing. . Notify the office if you experience any of the following signs of infection: Swelling, redness, pus drainage, streaking, fever >101.0 F . Notify the office if you experience excessive bleeding that does not stop after 15-20 minutes of constant, firm pressure.      IF you received an x-ray today, you will receive an invoice from [facility]. Please contact [facility] at [phone] with questions or concerns regarding your invoice.   IF you received labwork today, you will receive an invoice from [employer]. Please contact [facility] at [phone] with questions or concerns regarding your invoice.   Our billing staff will not be able to assist you with questions regarding bills from these companies.  You will be contacted with the lab results as soon as they are available. The fastest way to get your results is to activate your My Chart account. Instructions are located on the last page of this paperwork. If you have not heard from [place] regarding the results in 2 weeks, please contact this office.

## 2016-02-22 ENCOUNTER — Ambulatory Visit (INDEPENDENT_AMBULATORY_CARE_PROVIDER_SITE_OTHER): Payer: BLUE CROSS/BLUE SHIELD | Admitting: Urgent Care

## 2016-02-22 ENCOUNTER — Encounter: Payer: Self-pay | Admitting: Urgent Care

## 2016-02-22 VITALS — BP 118/80 | HR 90 | Temp 98.0°F | Resp 16 | Ht 63.0 in | Wt 143.0 lb

## 2016-02-22 DIAGNOSIS — S0101XD Laceration without foreign body of scalp, subsequent encounter: Secondary | ICD-10-CM

## 2016-02-22 NOTE — Progress Notes (Signed)
   Patient: Gloria Howell [id number]  Subjective: Electa is returning for suture removal. Patient was initially seen 02/16/2016 and had 3 simple interrupted sutures placed. Admits slight tenderness over her wound. Denies fever, drainage of pus or blood, wound dehiscence, edema.   Objective: Physical Exam  HENT:  Head:      Sutures were not removed.  Assessment and Plan: Well-healing wound, however, I believed that removing the sutures would lead to wound dehiscence. I advised patient rtc on Monday when she is back in town for suture removal. Patient agreed.  Jaynee Eagles, PA-C [facility] [phone] 02/22/2016  8:41 AM

## 2016-02-22 NOTE — Patient Instructions (Signed)
     IF you received an x-ray today, you will receive an invoice from [facility]. Please contact [facility] at [phone] with questions or concerns regarding your invoice.   IF you received labwork today, you will receive an invoice from [facility]. Please contact [facility] at [phone] with questions or concerns regarding your invoice.   Our billing staff will not be able to assist you with questions regarding bills from these companies.  You will be contacted with the lab results as soon as they are available. The fastest way to get your results is to activate your My Chart account. Instructions are located on the last page of this paperwork. If you have not heard from us regarding the results in 2 weeks, please contact this office.      

## 2016-02-26 ENCOUNTER — Ambulatory Visit (INDEPENDENT_AMBULATORY_CARE_PROVIDER_SITE_OTHER): Payer: BLUE CROSS/BLUE SHIELD | Admitting: Physician Assistant

## 2016-02-26 VITALS — BP 124/74 | HR 77 | Temp 98.7°F | Resp 17 | Ht 64.0 in | Wt 144.0 lb

## 2016-02-26 DIAGNOSIS — S0101XD Laceration without foreign body of scalp, subsequent encounter: Secondary | ICD-10-CM

## 2016-02-26 NOTE — Patient Instructions (Signed)
     IF you received an x-ray today, you will receive an invoice from [facility]. Please contact [facility] at [phone] with questions or concerns regarding your invoice.   IF you received labwork today, you will receive an invoice from [facility]. Please contact [facility] at [phone] with questions or concerns regarding your invoice.   Our billing staff will not be able to assist you with questions regarding bills from these companies.  You will be contacted with the lab results as soon as they are available. The fastest way to get your results is to activate your My Chart account. Instructions are located on the last page of this paperwork. If you have not heard from us regarding the results in 2 weeks, please contact this office.      

## 2016-02-26 NOTE — Progress Notes (Signed)
Chief Complaint  Patient presents with  . Suture / Staple Removal    History of Present Illness: Patient presents for suture removal.  Scalp laceration repaired here on 02/16/2016. When she presented for suture removal on 4/27, there was inadequate healing for removal. She was advised to return today for suture removal.  No pain, drainage, swelling. No fever/chills.  She notes that the area is itchy.   No Known Allergies  Prior to Admission medications   Not on File    There are no active problems to display for this patient.    Physical Exam  Constitutional: She is oriented to person, place, and time. She appears well-developed and well-nourished. She is active and cooperative. No distress.  BP 124/74 mmHg  Pulse 77  Temp(Src) 98.7 F (37.1 C) (Oral)  Resp 17  Ht 5\' 4"  (1.626 m)  Wt 144 lb (65.318 kg)  BMI 24.71 kg/m2  SpO2 99%  LMP 02/08/2016 (Approximate)   Eyes: Conjunctivae are normal.  Pulmonary/Chest: Effort normal.  Neurological: She is alert and oriented to person, place, and time.  Skin: Skin is warm and dry. Laceration (well-healed wound on the posterior scalp with #3 Prolene SI sutures. Removed without incident.) noted.  Psychiatric: She has a normal mood and affect. Her speech is normal and behavior is normal.      ASSESSMENT & PLAN:  1. Laceration of scalp, subsequent encounter Healed. Sutures removed. Anticipatory guidance provided.   Fara Chute, PA-C Physician Assistant-Certified [facility]

## 2016-05-20 ENCOUNTER — Ambulatory Visit: Payer: Self-pay | Admitting: General Surgery

## 2016-12-27 DIAGNOSIS — N760 Acute vaginitis: Secondary | ICD-10-CM | POA: Diagnosis not present

## 2016-12-27 DIAGNOSIS — L293 Anogenital pruritus, unspecified: Secondary | ICD-10-CM | POA: Diagnosis not present

## 2017-03-30 ENCOUNTER — Emergency Department (HOSPITAL_COMMUNITY): Payer: BLUE CROSS/BLUE SHIELD

## 2017-03-30 ENCOUNTER — Encounter (HOSPITAL_COMMUNITY): Payer: Self-pay | Admitting: *Deleted

## 2017-03-30 ENCOUNTER — Emergency Department (HOSPITAL_COMMUNITY)
Admission: EM | Admit: 2017-03-30 | Discharge: 2017-03-30 | Disposition: A | Discharge status: Home / Self Care | Payer: BLUE CROSS/BLUE SHIELD | Attending: Emergency Medicine | Admitting: Emergency Medicine

## 2017-03-30 DIAGNOSIS — T148XXA Other injury of unspecified body region, initial encounter: Secondary | ICD-10-CM | POA: Diagnosis not present

## 2017-03-30 DIAGNOSIS — Y999 Unspecified external cause status: Secondary | ICD-10-CM | POA: Insufficient documentation

## 2017-03-30 DIAGNOSIS — S82842A Displaced bimalleolar fracture of left lower leg, initial encounter for closed fracture: Secondary | ICD-10-CM | POA: Diagnosis not present

## 2017-03-30 DIAGNOSIS — S82892A Other fracture of left lower leg, initial encounter for closed fracture: Secondary | ICD-10-CM

## 2017-03-30 DIAGNOSIS — M79605 Pain in left leg: Secondary | ICD-10-CM | POA: Diagnosis not present

## 2017-03-30 DIAGNOSIS — S99912A Unspecified injury of left ankle, initial encounter: Secondary | ICD-10-CM | POA: Diagnosis present

## 2017-03-30 DIAGNOSIS — Y9234 Swimming pool (public) as the place of occurrence of the external cause: Secondary | ICD-10-CM | POA: Insufficient documentation

## 2017-03-30 DIAGNOSIS — W010XXA Fall on same level from slipping, tripping and stumbling without subsequent striking against object, initial encounter: Secondary | ICD-10-CM | POA: Insufficient documentation

## 2017-03-30 DIAGNOSIS — Y9389 Activity, other specified: Secondary | ICD-10-CM | POA: Diagnosis not present

## 2017-03-30 DIAGNOSIS — S8252XA Displaced fracture of medial malleolus of left tibia, initial encounter for closed fracture: Secondary | ICD-10-CM | POA: Insufficient documentation

## 2017-03-30 MED ORDER — FENTANYL CITRATE (PF) 100 MCG/2ML IJ SOLN
INTRAMUSCULAR | Status: AC
  Filled 2017-03-30: qty 2

## 2017-03-30 MED ORDER — FENTANYL CITRATE (PF) 100 MCG/2ML IJ SOLN
50.0000 ug | INTRAMUSCULAR | Status: DC | PRN
  Administered 2017-03-30: 50 ug via INTRAVENOUS
  Filled 2017-03-30: qty 2

## 2017-03-30 MED ORDER — MELOXICAM 15 MG PO TABS: 15.0000 mg | ORAL_TABLET | Freq: Every day | ORAL | 0 refills | Status: DC

## 2017-03-30 MED ORDER — FENTANYL CITRATE (PF) 100 MCG/2ML IJ SOLN
100.0000 ug | Freq: Once | INTRAMUSCULAR | Status: AC
Start: 2017-03-30 — End: 2017-03-30
  Administered 2017-03-30: 100 ug via INTRAVENOUS

## 2017-03-30 MED ORDER — HYDROCODONE-ACETAMINOPHEN 5-325 MG PO TABS: 1.0000 | ORAL_TABLET | Freq: Four times a day (QID) | ORAL | 0 refills | Status: DC | PRN

## 2017-03-30 NOTE — ED Notes (Signed)
ICE GIVEN AND ELEVATED TO LLE

## 2017-03-30 NOTE — ED Notes (Signed)
Bed: UP10 Expected date:  Expected time:  Means of arrival:  Comments: 41 yo L ankle injury- fentanyl given

## 2017-03-30 NOTE — ED Notes (Signed)
ORTHO TECH JON PRESENT EDUCATING FAMILY AND PT

## 2017-03-30 NOTE — ED Notes (Signed)
Splint with ace to LLE WITH GOOD CMS. EDUCATED ON REASONS TO RETURN TO EMERGENCY ROOM.
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
## 2017-03-30 NOTE — Discharge Instructions (Signed)
Contact a health care provider if: You develop increased swelling or discomfort. Get help right away if: Your cast gets damaged or breaks. You have continued severe pain. You develop new pain or swelling after the cast was put on. Your skin or toenails below the injury turn blue or gray. Your skin or toenails below the injury feel cold, numb, or have loss of sensitivity to touch. There is a bad smell or pus draining from under the cast.
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
## 2017-03-31 DIAGNOSIS — S82842A Displaced bimalleolar fracture of left lower leg, initial encounter for closed fracture: Secondary | ICD-10-CM | POA: Diagnosis not present

## 2017-04-08 DIAGNOSIS — G8918 Other acute postprocedural pain: Secondary | ICD-10-CM | POA: Diagnosis not present

## 2017-04-08 DIAGNOSIS — X58XXXA Exposure to other specified factors, initial encounter: Secondary | ICD-10-CM | POA: Diagnosis not present

## 2017-04-08 DIAGNOSIS — Y999 Unspecified external cause status: Secondary | ICD-10-CM | POA: Diagnosis not present

## 2017-04-08 DIAGNOSIS — S82842A Displaced bimalleolar fracture of left lower leg, initial encounter for closed fracture: Secondary | ICD-10-CM | POA: Diagnosis not present

## 2017-04-25 DIAGNOSIS — S82842D Displaced bimalleolar fracture of left lower leg, subsequent encounter for closed fracture with routine healing: Secondary | ICD-10-CM | POA: Diagnosis not present

## 2017-05-09 DIAGNOSIS — R87610 Atypical squamous cells of undetermined significance on cytologic smear of cervix (ASC-US): Secondary | ICD-10-CM | POA: Insufficient documentation

## 2017-05-09 DIAGNOSIS — Z6826 Body mass index (BMI) 26.0-26.9, adult: Secondary | ICD-10-CM | POA: Diagnosis not present

## 2017-05-09 DIAGNOSIS — Z1212 Encounter for screening for malignant neoplasm of rectum: Secondary | ICD-10-CM | POA: Diagnosis not present

## 2017-05-09 DIAGNOSIS — Z01419 Encounter for gynecological examination (general) (routine) without abnormal findings: Secondary | ICD-10-CM | POA: Diagnosis not present

## 2017-05-16 DIAGNOSIS — Z1231 Encounter for screening mammogram for malignant neoplasm of breast: Secondary | ICD-10-CM | POA: Diagnosis not present

## 2017-05-23 DIAGNOSIS — S82842D Displaced bimalleolar fracture of left lower leg, subsequent encounter for closed fracture with routine healing: Secondary | ICD-10-CM | POA: Diagnosis not present

## 2017-05-30 DIAGNOSIS — S82842D Displaced bimalleolar fracture of left lower leg, subsequent encounter for closed fracture with routine healing: Secondary | ICD-10-CM | POA: Diagnosis not present

## 2017-06-02 DIAGNOSIS — S82842D Displaced bimalleolar fracture of left lower leg, subsequent encounter for closed fracture with routine healing: Secondary | ICD-10-CM | POA: Diagnosis not present

## 2017-06-02 DIAGNOSIS — R87610 Atypical squamous cells of undetermined significance on cytologic smear of cervix (ASC-US): Secondary | ICD-10-CM | POA: Diagnosis not present

## 2017-06-02 DIAGNOSIS — R87810 Cervical high risk human papillomavirus (HPV) DNA test positive: Secondary | ICD-10-CM | POA: Diagnosis not present

## 2017-06-05 DIAGNOSIS — S82842D Displaced bimalleolar fracture of left lower leg, subsequent encounter for closed fracture with routine healing: Secondary | ICD-10-CM | POA: Diagnosis not present

## 2017-06-09 DIAGNOSIS — S82842D Displaced bimalleolar fracture of left lower leg, subsequent encounter for closed fracture with routine healing: Secondary | ICD-10-CM | POA: Diagnosis not present

## 2017-06-13 DIAGNOSIS — S82842A Displaced bimalleolar fracture of left lower leg, initial encounter for closed fracture: Secondary | ICD-10-CM | POA: Diagnosis not present

## 2017-06-16 DIAGNOSIS — S82842D Displaced bimalleolar fracture of left lower leg, subsequent encounter for closed fracture with routine healing: Secondary | ICD-10-CM | POA: Diagnosis not present

## 2017-06-20 DIAGNOSIS — S82842D Displaced bimalleolar fracture of left lower leg, subsequent encounter for closed fracture with routine healing: Secondary | ICD-10-CM | POA: Diagnosis not present

## 2017-06-23 DIAGNOSIS — S82842D Displaced bimalleolar fracture of left lower leg, subsequent encounter for closed fracture with routine healing: Secondary | ICD-10-CM | POA: Diagnosis not present

## 2017-06-24 DIAGNOSIS — S82842D Displaced bimalleolar fracture of left lower leg, subsequent encounter for closed fracture with routine healing: Secondary | ICD-10-CM | POA: Diagnosis not present

## 2017-06-27 DIAGNOSIS — S82842D Displaced bimalleolar fracture of left lower leg, subsequent encounter for closed fracture with routine healing: Secondary | ICD-10-CM | POA: Diagnosis not present

## 2017-07-07 DIAGNOSIS — S82842D Displaced bimalleolar fracture of left lower leg, subsequent encounter for closed fracture with routine healing: Secondary | ICD-10-CM | POA: Diagnosis not present

## 2017-07-11 DIAGNOSIS — S82842D Displaced bimalleolar fracture of left lower leg, subsequent encounter for closed fracture with routine healing: Secondary | ICD-10-CM | POA: Diagnosis not present

## 2017-07-14 DIAGNOSIS — S82842D Displaced bimalleolar fracture of left lower leg, subsequent encounter for closed fracture with routine healing: Secondary | ICD-10-CM | POA: Diagnosis not present

## 2017-07-18 DIAGNOSIS — S82842D Displaced bimalleolar fracture of left lower leg, subsequent encounter for closed fracture with routine healing: Secondary | ICD-10-CM | POA: Diagnosis not present

## 2017-07-21 DIAGNOSIS — S82842D Displaced bimalleolar fracture of left lower leg, subsequent encounter for closed fracture with routine healing: Secondary | ICD-10-CM | POA: Diagnosis not present

## 2017-07-24 DIAGNOSIS — S82842D Displaced bimalleolar fracture of left lower leg, subsequent encounter for closed fracture with routine healing: Secondary | ICD-10-CM | POA: Diagnosis not present

## 2017-07-25 DIAGNOSIS — S82842D Displaced bimalleolar fracture of left lower leg, subsequent encounter for closed fracture with routine healing: Secondary | ICD-10-CM | POA: Diagnosis not present

## 2017-07-25 DIAGNOSIS — Z09 Encounter for follow-up examination after completed treatment for conditions other than malignant neoplasm: Secondary | ICD-10-CM | POA: Diagnosis not present

## 2017-08-01 DIAGNOSIS — S82842D Displaced bimalleolar fracture of left lower leg, subsequent encounter for closed fracture with routine healing: Secondary | ICD-10-CM | POA: Diagnosis not present

## 2017-08-08 DIAGNOSIS — S82842D Displaced bimalleolar fracture of left lower leg, subsequent encounter for closed fracture with routine healing: Secondary | ICD-10-CM | POA: Diagnosis not present

## 2017-08-15 DIAGNOSIS — S82842D Displaced bimalleolar fracture of left lower leg, subsequent encounter for closed fracture with routine healing: Secondary | ICD-10-CM | POA: Diagnosis not present

## 2017-08-22 DIAGNOSIS — S82842D Displaced bimalleolar fracture of left lower leg, subsequent encounter for closed fracture with routine healing: Secondary | ICD-10-CM | POA: Diagnosis not present

## 2017-09-12 DIAGNOSIS — Z09 Encounter for follow-up examination after completed treatment for conditions other than malignant neoplasm: Secondary | ICD-10-CM | POA: Diagnosis not present

## 2017-09-12 DIAGNOSIS — T8484XA Pain due to internal orthopedic prosthetic devices, implants and grafts, initial encounter: Secondary | ICD-10-CM | POA: Diagnosis not present

## 2017-09-16 DIAGNOSIS — T8484XA Pain due to internal orthopedic prosthetic devices, implants and grafts, initial encounter: Secondary | ICD-10-CM | POA: Diagnosis not present

## 2017-11-17 DIAGNOSIS — H5201 Hypermetropia, right eye: Secondary | ICD-10-CM | POA: Diagnosis not present

## 2017-12-08 DIAGNOSIS — R87610 Atypical squamous cells of undetermined significance on cytologic smear of cervix (ASC-US): Secondary | ICD-10-CM | POA: Diagnosis not present

## 2018-04-03 ENCOUNTER — Ambulatory Visit (INDEPENDENT_AMBULATORY_CARE_PROVIDER_SITE_OTHER): Payer: BLUE CROSS/BLUE SHIELD | Admitting: Internal Medicine

## 2018-04-03 ENCOUNTER — Encounter: Payer: Self-pay | Admitting: Internal Medicine

## 2018-04-03 VITALS — BP 110/80 | HR 89 | Temp 98.9°F | Ht 63.0 in | Wt 146.0 lb

## 2018-04-03 DIAGNOSIS — J069 Acute upper respiratory infection, unspecified: Secondary | ICD-10-CM

## 2018-04-03 DIAGNOSIS — H8110 Benign paroxysmal vertigo, unspecified ear: Secondary | ICD-10-CM | POA: Diagnosis not present

## 2018-04-03 DIAGNOSIS — R42 Dizziness and giddiness: Secondary | ICD-10-CM | POA: Diagnosis not present

## 2018-04-03 MED ORDER — FLUOCINONIDE 0.05 % EX SOLN: CUTANEOUS | 0 refills | Status: DC

## 2018-04-03 MED ORDER — METHYLPREDNISOLONE ACETATE 80 MG/ML IJ SUSP
80.0000 mg | Freq: Once | INTRAMUSCULAR | Status: AC
  Administered 2018-04-03: 80 mg via INTRAMUSCULAR

## 2018-04-03 MED ORDER — AZITHROMYCIN 250 MG PO TABS: ORAL_TABLET | ORAL | 0 refills | Status: DC

## 2018-04-03 NOTE — Progress Notes (Signed)
   Subjective:    Patient ID: Gloria Howell, female    DOB: [birth year], 42 y.o.   MRN: [id number]  HPI 42 year old Female with "head cold" around May 21st treated with tele-visit with Amoxicillin by another provider. We do not have those records in [place].Has had itching in right ear for someone time. Ears have not gotten better with Amoxicillin. Still feel ear congestion. Vertigo started even prior to head cold. Working at [employer] 3 days a week.  Not seen here since 2016.      Review of Systems see above     Objective:   Physical Exam Skin warm and dry.  Nodes none.  PERRLA.  Extraocular movements are full.  TMs are clear.  Pharynx is clear.  Neck is supple.  Chest clear.  No significant nystagmus demonstrated.       Assessment & Plan:  Benign positional vertigo  Protracted respiratory infection  Plan: Depo-Medrol 80 mg IM.  Zithromax Z-Pak take 2 tablets day 1 followed by 1 tablet days 2 through 5.  May take meclizine for dizziness.  Purchase this over-the-counter.

## 2018-04-09 DIAGNOSIS — L089 Local infection of the skin and subcutaneous tissue, unspecified: Secondary | ICD-10-CM | POA: Diagnosis not present

## 2018-04-09 DIAGNOSIS — D171 Benign lipomatous neoplasm of skin and subcutaneous tissue of trunk: Secondary | ICD-10-CM | POA: Diagnosis not present

## 2018-04-14 ENCOUNTER — Ambulatory Visit: Payer: Self-pay | Admitting: General Surgery

## 2018-04-17 DIAGNOSIS — L089 Local infection of the skin and subcutaneous tissue, unspecified: Secondary | ICD-10-CM | POA: Diagnosis not present

## 2018-04-17 DIAGNOSIS — L723 Sebaceous cyst: Secondary | ICD-10-CM | POA: Diagnosis not present

## 2018-04-20 ENCOUNTER — Telehealth: Payer: Self-pay | Admitting: Internal Medicine

## 2018-04-20 NOTE — Telephone Encounter (Signed)
Unfortunately we do not have any records from the procedure. I have called her and left a voice mail. I think she should ask for an appointment to see the surgeon. If that is not possible, she could consider a plastic surgery appointment.

## 2018-04-20 NOTE — Telephone Encounter (Signed)
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  She decided to see a Psychologist, sport and exercise about it.  She was seen in consult for it and the PA decided it excise it to see if he could get some of the drainage out of it.  She now has this hole in her back and it had to be packed.  She said she like the PA and the surgery was scheduled with Dr. Cherlyn Roberts for 8/15, but she didn't get to see Dr. Cherlyn Roberts.    She was told that she would need to pack this hole in her back and change the packing every day for the next week to 10 days.  She said that she cannot reach the area on her lower back to do this packing and that she is not up to doing this because #1, it is painful to do so, #2, she cannot reach the area to do the packing and cleaning of the area, #3, she needs to enlist the help of family in order to do the packing and cleaning of the area.  She called the Surgeon's office back and they continue to send her to the Nurse Triage area and they said they would sign her up to come in and have a nurse do this for her each day.    She said at this point, she is beginning to wonder if she should have a second opinion before having this surgery on her back.  She said she DID like the PA who did this to her back, however, she is just anxious now about the entire ordeal.  She has had this place on her back for 20+ years and she just went to have a consult and now she has this hole in her back and requires packing and a change of a wound every day.    He put her on Doxycycline x 7 days.  Now she is on 5 more days of Doxycycline and she feels like the drainage now that she probably has MRSA.  She really wanted to get your advice and see if you feel that she should get a 2nd opinion and if so, who would you recommend?

## 2018-04-23 NOTE — Telephone Encounter (Signed)
Patient called states she was able to get in with the general surgeon Dr. Kieth Brightly, they have arranged for the nurse to see her weekly to pack the wound and she just wanted to thank you for the referral.

## 2018-04-26 NOTE — Patient Instructions (Signed)
May take meclizine for dizziness.  Depo-Medrol 80 mg IM.  Take Zithromax Z-PAK 2 p.o. day 1 followed by 1 p.o. days 2 through 5.

## 2018-04-29 DIAGNOSIS — L723 Sebaceous cyst: Secondary | ICD-10-CM | POA: Diagnosis not present

## 2018-04-29 DIAGNOSIS — L089 Local infection of the skin and subcutaneous tissue, unspecified: Secondary | ICD-10-CM | POA: Diagnosis not present

## 2018-05-28 DIAGNOSIS — L720 Epidermal cyst: Secondary | ICD-10-CM | POA: Diagnosis not present

## 2018-05-28 DIAGNOSIS — L718 Other rosacea: Secondary | ICD-10-CM | POA: Diagnosis not present

## 2018-06-04 DIAGNOSIS — Z6825 Body mass index (BMI) 25.0-25.9, adult: Secondary | ICD-10-CM | POA: Diagnosis not present

## 2018-06-04 DIAGNOSIS — Z1212 Encounter for screening for malignant neoplasm of rectum: Secondary | ICD-10-CM | POA: Diagnosis not present

## 2018-06-04 DIAGNOSIS — Z01419 Encounter for gynecological examination (general) (routine) without abnormal findings: Secondary | ICD-10-CM | POA: Diagnosis not present

## 2018-06-04 DIAGNOSIS — Z1231 Encounter for screening mammogram for malignant neoplasm of breast: Secondary | ICD-10-CM | POA: Diagnosis not present

## 2018-06-18 ENCOUNTER — Ambulatory Visit: Payer: Self-pay | Admitting: General Surgery

## 2018-07-15 ENCOUNTER — Ambulatory Visit (HOSPITAL_BASED_OUTPATIENT_CLINIC_OR_DEPARTMENT_OTHER): Admit: 2018-07-15 | Payer: BLUE CROSS/BLUE SHIELD | Admitting: General Surgery

## 2018-07-15 ENCOUNTER — Encounter (HOSPITAL_BASED_OUTPATIENT_CLINIC_OR_DEPARTMENT_OTHER): Payer: Self-pay

## 2018-07-15 SURGERY — EXCISION MASS
Anesthesia: Choice

## 2018-10-05 ENCOUNTER — Ambulatory Visit
Admission: RE | Admit: 2018-10-05 | Discharge: 2018-10-05 | Disposition: A | Discharge status: Home / Self Care | Payer: BLUE CROSS/BLUE SHIELD | Source: Ambulatory Visit | Attending: Internal Medicine | Admitting: Internal Medicine

## 2018-10-05 ENCOUNTER — Ambulatory Visit (INDEPENDENT_AMBULATORY_CARE_PROVIDER_SITE_OTHER): Payer: BLUE CROSS/BLUE SHIELD | Admitting: Internal Medicine

## 2018-10-05 ENCOUNTER — Encounter: Payer: Self-pay | Admitting: Internal Medicine

## 2018-10-05 VITALS — BP 160/100 | HR 107 | Ht 63.0 in | Wt 150.0 lb

## 2018-10-05 DIAGNOSIS — S0992XA Unspecified injury of nose, initial encounter: Secondary | ICD-10-CM | POA: Diagnosis not present

## 2018-10-05 DIAGNOSIS — W19XXXA Unspecified fall, initial encounter: Secondary | ICD-10-CM | POA: Diagnosis not present

## 2018-10-05 DIAGNOSIS — S0033XA Contusion of nose, initial encounter: Secondary | ICD-10-CM | POA: Diagnosis not present

## 2018-10-05 DIAGNOSIS — R220 Localized swelling, mass and lump, head: Secondary | ICD-10-CM | POA: Diagnosis not present

## 2018-10-05 NOTE — Progress Notes (Signed)
   Subjective:    Patient ID: Lendon Ka Herrera, female    DOB: [birth year], 42 y.o.   MRN: [id number]  HPI 42 year old Female in today with injury to her nose area.  She fell from her bed accidentally on December 6.  Had no loss of consciousness but wound up with swelling and pain of nose.  Is concerned and would like to have it checked today.  Reported infraorbital contusions and soft tissue swelling of her nose after the accident.    Review of Systems no other complaints     Objective:   Physical Exam  She has swelling of her nose.  Extraocular movements are full.  PERRLA.  Brief neurological exam no focal deficits.  X-ray of nasal bones shows no evidence of fracture.  On exam today, no evidence of infraorbital contusions      Assessment & Plan:  Nasal contusion secondary to fall  Plan: Patient reassured

## 2018-10-26 NOTE — Patient Instructions (Addendum)
Patient reassured there is no evidence of nasal fracture.  Swelling should subside in the next couple of weeks

## 2018-12-20 ENCOUNTER — Telehealth: Payer: Self-pay | Admitting: Internal Medicine

## 2018-12-20 ENCOUNTER — Encounter: Payer: Self-pay | Admitting: Internal Medicine

## 2018-12-20 MED ORDER — OSELTAMIVIR PHOSPHATE 75 MG PO CAPS: 75.0000 mg | ORAL_CAPSULE | Freq: Every day | ORAL | 0 refills | Status: DC

## 2018-12-20 NOTE — Telephone Encounter (Signed)
Exposure to flu in son and husband. Son tested positive for Influenza A yesterday and husband has symptoms. Call in Tamiflu 75 mg daily x 5 days

## 2019-02-15 ENCOUNTER — Telehealth: Payer: Self-pay

## 2019-02-15 ENCOUNTER — Ambulatory Visit (INDEPENDENT_AMBULATORY_CARE_PROVIDER_SITE_OTHER): Payer: BLUE CROSS/BLUE SHIELD | Admitting: Internal Medicine

## 2019-02-15 ENCOUNTER — Encounter: Payer: Self-pay | Admitting: Internal Medicine

## 2019-02-15 ENCOUNTER — Other Ambulatory Visit: Payer: Self-pay

## 2019-02-15 DIAGNOSIS — L03012 Cellulitis of left finger: Secondary | ICD-10-CM

## 2019-02-15 NOTE — Telephone Encounter (Signed)
Schedule virtual visit 

## 2019-02-15 NOTE — Telephone Encounter (Signed)
Scheduled

## 2019-02-15 NOTE — Patient Instructions (Signed)
Continue Augmentin 875 mg twice daily with meals for an additional 24 hours and call tomorrow with progress report.  Soak left third finger in warm soapy water 20 minutes twice daily.

## 2019-02-15 NOTE — Progress Notes (Signed)
   Subjective:    Patient ID: Gloria Howell, female    DOB: 15-Oct-1976, 43 y.o.   MRN: [id number]  HPI 2 days ago the patient was awakened with tenderness in her left axilla.  Did not recall any strain or injury to that area.  Then noticed she had redness left third finger adjacent to fingernail.  Developed some achiness and chills.  Had temperature up to 101 degrees on April 18.  She had a virtual visit with physician who felt she likely had bacterial infection from paronychia.  Was started on Augmentin 875 mg twice daily.  He has had a total of 4 doses.  Started medication Saturday evening around 10:30 PM.  Patient was seen today with interactive audio and video telecommunications.  Patient identified as Gloria Howell with 2 identifiers.  Last night had temperature up to around 102 degrees by her thermometer.  This morning she has no fever.  No nausea or vomiting.  Tolerating Augmentin fairly well.  Advised to take with a meal.  Still has discomfort in left axilla and a little bit in left inner arm above elbow but no redness in those areas.    Review of Systems see above-has slight headache     Objective:   Physical Exam  She has paronychia left third finger with erythema but no apparent drainage.  No erythema on upper arm.      Assessment & Plan:  Paronychia left third finger with associated lymphangitis  Plan: I would like her to continue Augmentin for an additional 24 hours and call tomorrow with progress report.  May take NSAIDs or Tylenol for fever and headache.  Soak finger in warm soapy water for 20 minutes twice daily.

## 2019-02-15 NOTE — Telephone Encounter (Signed)
Patient called on Friday she had body aches, fever and chills she noticed she had a "hang nail" that looked infected then later on that day her lymph nodes on her neck were swollen and her neck was sore she did an ev-visit and was prescribed augmentin which she started on Saturday but that evening she started feeling feverish yesterday morning she felt better but fever came back at night and was 103.0 this morning she feels better but lymph nodes still somewhat swollen and she has a mild headache, she would like to know if you think she needs to be seen by you.

## 2019-05-06 ENCOUNTER — Ambulatory Visit (INDEPENDENT_AMBULATORY_CARE_PROVIDER_SITE_OTHER): Payer: BC Managed Care – PPO | Admitting: Internal Medicine

## 2019-05-06 ENCOUNTER — Encounter: Payer: Self-pay | Admitting: Internal Medicine

## 2019-05-06 ENCOUNTER — Telehealth: Payer: Self-pay | Admitting: Internal Medicine

## 2019-05-06 ENCOUNTER — Other Ambulatory Visit: Payer: Self-pay

## 2019-05-06 VITALS — BP 120/90 | Temp 98.3°F | Ht 63.0 in | Wt 155.0 lb

## 2019-05-06 DIAGNOSIS — F411 Generalized anxiety disorder: Secondary | ICD-10-CM

## 2019-05-06 DIAGNOSIS — R718 Other abnormality of red blood cells: Secondary | ICD-10-CM

## 2019-05-06 DIAGNOSIS — R1030 Lower abdominal pain, unspecified: Secondary | ICD-10-CM | POA: Diagnosis not present

## 2019-05-06 DIAGNOSIS — R1032 Left lower quadrant pain: Secondary | ICD-10-CM | POA: Diagnosis not present

## 2019-05-06 LAB — CBC WITH DIFFERENTIAL/PLATELET
Absolute Monocytes: 394 cells/uL (ref 200–950)
Basophils Absolute: 29 cells/uL (ref 0–200)
Basophils Relative: 0.3 %
Eosinophils Absolute: 10 cells/uL — ABNORMAL LOW (ref 15–500)
Eosinophils Relative: 0.1 %
HCT: 36.3 % (ref 35.0–45.0)
Hemoglobin: 11.2 g/dL — ABNORMAL LOW (ref 11.7–15.5)
Lymphs Abs: 1325 cells/uL (ref 850–3900)
MCH: 22.8 pg — ABNORMAL LOW (ref 27.0–33.0)
MCHC: 30.9 g/dL — ABNORMAL LOW (ref 32.0–36.0)
MCV: 73.9 fL — ABNORMAL LOW (ref 80.0–100.0)
MPV: 10.4 fL (ref 7.5–12.5)
Monocytes Relative: 4.1 %
Neutro Abs: 7843 cells/uL — ABNORMAL HIGH (ref 1500–7800)
Neutrophils Relative %: 81.7 %
Platelets: 424 10*3/uL — ABNORMAL HIGH (ref 140–400)
RBC: 4.91 10*6/uL (ref 3.80–5.10)
RDW: 16.2 % — ABNORMAL HIGH (ref 11.0–15.0)
Total Lymphocyte: 13.8 %
WBC: 9.6 10*3/uL (ref 3.8–10.8)

## 2019-05-06 LAB — POCT URINALYSIS DIPSTICK
Appearance: NEGATIVE
Bilirubin, UA: NEGATIVE
Blood, UA: NEGATIVE
Glucose, UA: NEGATIVE
Ketones, UA: NEGATIVE
Leukocytes, UA: NEGATIVE
Nitrite, UA: NEGATIVE
Odor: NEGATIVE
Protein, UA: NEGATIVE
Spec Grav, UA: 1.01 (ref 1.010–1.025)
Urobilinogen, UA: 0.2 E.U./dL
pH, UA: 7 (ref 5.0–8.0)

## 2019-05-06 MED ORDER — CIPROFLOXACIN HCL 500 MG PO TABS: 500.0000 mg | ORAL_TABLET | Freq: Two times a day (BID) | ORAL | 0 refills | Status: DC

## 2019-05-06 MED ORDER — ALPRAZOLAM 0.25 MG PO TABS: ORAL_TABLET | ORAL | 0 refills | Status: DC

## 2019-05-06 NOTE — Telephone Encounter (Signed)
After talking with Gloria Howell I went ahead and scheduled her for an appointment this morning.

## 2019-05-06 NOTE — Telephone Encounter (Signed)
Gloria Howell [phone]  Jaxson called to say that she was awaken last night with left side pain, just above her pelvic area, she went to bathroom, she had cold wave come over body and then went real pale. Felt like she was going to pass out. She urinated a few times and had a couple little episodes of diarrhea. She lay ed back down flat of her back with wet towel on her forehead, this helped. The pain is better this morning, she is very concerned because of the night she had.

## 2019-05-06 NOTE — Progress Notes (Signed)
   Subjective:    Patient ID: Gloria Howell, female    DOB: [birth year], 43 y.o.   MRN: [id number]  HPI 43 year old Female with onset of urinary frequency and LLQ pain in the night. No nausea or vomiting. Pain was in lower part of LLQ. No hematuria. No dysuria. Had menses last week which were heavier than usual. Had vagal symptoms with pain - felt light headed and improved with lying down and putting feet up.  Patient had an episode of acute diverticulitis in 2016.  This was confirmed on CT scan.  Was treated with Cipro and Flagyl and improved.  Patient says this feels very similar to that episode.  Denies urinary symptoms.  Has not had hematuria.  No prior history of kidney stones.    Review of Systems     Objective:   Physical Exam Abdomen is soft nondistended without hepatosplenomegaly.  Has some mild lower abdominal pain without rebound tenderness.  White blood cell count is normal.  Hemoglobin 11.2 g with MCV of 73.9.  Urine was sent for microscopic.  No white blood cells.  0-2 red blood cells per high-powered field.  No bacteria for casts.     Assessment & Plan:  Left lower quadrant abdominal pain-?  Acute diverticulitis.  Patient is in no acute distress this morning.  Recommend Cipro 500 mg twice daily for 7 days pending urine culture.  I do not think she needs to have CT of the abdomen at this point in time.  She could have perhaps passed a kidney stone.  Will send urine for microscopic evaluation.  She should stay on clear liquids and advance diet slowly.  She has mild microcytosis and would benefit from iron supplement.  We tried to add iron iron-binding capacity but were unable to do that through the lab.  Urine culture has been sent  Addendum: Urine culture reveals multiple organisms likely not a clean-catch.  Anxiety state-takes Xanax 0.25 mg twice daily as needed.  Call tomorrow with progress report.  Clear liquids and advance diet slowly.

## 2019-05-06 NOTE — Patient Instructions (Signed)
Cipro 500 mg twice daily for 7 days.  Clear liquids and advance diet slowly over the next couple of days.  Xanax 0.25 mg up to twice daily as needed for anxiety.  Call with progress report tomorrow.

## 2019-05-07 LAB — URINE CULTURE
MICRO NUMBER:: 650754
SPECIMEN QUALITY:: ADEQUATE

## 2019-05-07 LAB — URINALYSIS, MICROSCOPIC ONLY
Bacteria, UA: NONE SEEN /HPF
Hyaline Cast: NONE SEEN /LPF
Squamous Epithelial / HPF: NONE SEEN /HPF (ref <=5)
WBC, UA: NONE SEEN /HPF (ref 0–5)

## 2019-05-12 NOTE — Progress Notes (Signed)
This never got done. Needs to have these labs drawn to see if iron deficient. Has low MCV

## 2019-05-12 NOTE — Progress Notes (Signed)
This apparently was not done. She has microcytosis and could be iron deficient. Please get this done

## 2019-07-20 DIAGNOSIS — Z01419 Encounter for gynecological examination (general) (routine) without abnormal findings: Secondary | ICD-10-CM | POA: Diagnosis not present

## 2019-07-20 DIAGNOSIS — Z1231 Encounter for screening mammogram for malignant neoplasm of breast: Secondary | ICD-10-CM | POA: Diagnosis not present

## 2019-07-20 DIAGNOSIS — Z6826 Body mass index (BMI) 26.0-26.9, adult: Secondary | ICD-10-CM | POA: Diagnosis not present

## 2019-08-31 ENCOUNTER — Encounter: Payer: Self-pay | Admitting: Internal Medicine

## 2019-12-10 ENCOUNTER — Other Ambulatory Visit: Payer: BC Managed Care – PPO | Admitting: Internal Medicine

## 2019-12-13 ENCOUNTER — Encounter: Payer: BC Managed Care – PPO | Admitting: Internal Medicine

## 2020-02-04 ENCOUNTER — Other Ambulatory Visit: Payer: BC Managed Care – PPO | Admitting: Internal Medicine

## 2020-02-04 ENCOUNTER — Other Ambulatory Visit: Payer: Self-pay

## 2020-02-04 DIAGNOSIS — Z13220 Encounter for screening for lipoid disorders: Secondary | ICD-10-CM | POA: Diagnosis not present

## 2020-02-04 DIAGNOSIS — Z1321 Encounter for screening for nutritional disorder: Secondary | ICD-10-CM | POA: Diagnosis not present

## 2020-02-04 DIAGNOSIS — Z1329 Encounter for screening for other suspected endocrine disorder: Secondary | ICD-10-CM | POA: Diagnosis not present

## 2020-02-04 DIAGNOSIS — Z0000 Encounter for general adult medical examination without abnormal findings: Secondary | ICD-10-CM

## 2020-02-05 LAB — CBC WITH DIFFERENTIAL/PLATELET
Absolute Monocytes: 584 cells/uL (ref 200–950)
Basophils Absolute: 29 cells/uL (ref 0–200)
Basophils Relative: 0.4 %
Eosinophils Absolute: 80 cells/uL (ref 15–500)
Eosinophils Relative: 1.1 %
HCT: 39.8 % (ref 35.0–45.0)
Hemoglobin: 13.3 g/dL (ref 11.7–15.5)
Lymphs Abs: 2394 cells/uL (ref 850–3900)
MCH: 28.1 pg (ref 27.0–33.0)
MCHC: 33.4 g/dL (ref 32.0–36.0)
MCV: 84.1 fL (ref 80.0–100.0)
MPV: 10.1 fL (ref 7.5–12.5)
Monocytes Relative: 8 %
Neutro Abs: 4212 cells/uL (ref 1500–7800)
Neutrophils Relative %: 57.7 %
Platelets: 266 10*3/uL (ref 140–400)
RBC: 4.73 10*6/uL (ref 3.80–5.10)
RDW: 13.7 % (ref 11.0–15.0)
Total Lymphocyte: 32.8 %
WBC: 7.3 10*3/uL (ref 3.8–10.8)

## 2020-02-05 LAB — COMPLETE METABOLIC PANEL WITH GFR
AG Ratio: 1.6 (calc) (ref 1.0–2.5)
ALT: 19 U/L (ref 6–29)
AST: 17 U/L (ref 10–30)
Albumin: 4.4 g/dL (ref 3.6–5.1)
Alkaline phosphatase (APISO): 34 U/L (ref 31–125)
BUN: 13 mg/dL (ref 7–25)
CO2: 24 mmol/L (ref 20–32)
Calcium: 8.8 mg/dL (ref 8.6–10.2)
Chloride: 106 mmol/L (ref 98–110)
Creat: 0.59 mg/dL (ref 0.50–1.10)
GFR, Est African American: 130 mL/min/{1.73_m2} (ref >=60)
GFR, Est Non African American: 112 mL/min/{1.73_m2} (ref >=60)
Globulin: 2.7 g/dL (calc) (ref 1.9–3.7)
Glucose, Bld: 90 mg/dL (ref 65–99)
Potassium: 4.3 mmol/L (ref 3.5–5.3)
Sodium: 138 mmol/L (ref 135–146)
Total Bilirubin: 0.7 mg/dL (ref 0.2–1.2)
Total Protein: 7.1 g/dL (ref 6.1–8.1)

## 2020-02-05 LAB — LIPID PANEL
Cholesterol: 171 mg/dL (ref <200)
HDL: 41 mg/dL — ABNORMAL LOW (ref >=50)
LDL Cholesterol (Calc): 102 mg/dL (calc) — ABNORMAL HIGH
Non-HDL Cholesterol (Calc): 130 mg/dL (calc) — ABNORMAL HIGH (ref <130)
Total CHOL/HDL Ratio: 4.2 (calc) (ref <5.0)
Triglycerides: 166 mg/dL — ABNORMAL HIGH (ref <150)

## 2020-02-05 LAB — TSH: TSH: 4.18 mIU/L

## 2020-02-05 LAB — VITAMIN D 25 HYDROXY (VIT D DEFICIENCY, FRACTURES): Vit D, 25-Hydroxy: 25 ng/mL — ABNORMAL LOW (ref 30–100)

## 2020-02-07 ENCOUNTER — Encounter: Payer: Self-pay | Admitting: Internal Medicine

## 2020-02-07 ENCOUNTER — Ambulatory Visit (INDEPENDENT_AMBULATORY_CARE_PROVIDER_SITE_OTHER): Payer: BC Managed Care – PPO | Admitting: Internal Medicine

## 2020-02-07 ENCOUNTER — Other Ambulatory Visit: Payer: Self-pay

## 2020-02-07 VITALS — BP 120/90 | HR 101 | Temp 98.6°F | Ht 63.0 in | Wt 159.0 lb

## 2020-02-07 DIAGNOSIS — Z0000 Encounter for general adult medical examination without abnormal findings: Secondary | ICD-10-CM | POA: Diagnosis not present

## 2020-02-07 DIAGNOSIS — E559 Vitamin D deficiency, unspecified: Secondary | ICD-10-CM

## 2020-02-07 DIAGNOSIS — F411 Generalized anxiety disorder: Secondary | ICD-10-CM

## 2020-02-07 DIAGNOSIS — E781 Pure hyperglyceridemia: Secondary | ICD-10-CM

## 2020-02-07 LAB — POCT URINALYSIS DIPSTICK
Appearance: NEGATIVE
Bilirubin, UA: NEGATIVE
Blood, UA: NEGATIVE
Glucose, UA: NEGATIVE
Ketones, UA: NEGATIVE
Leukocytes, UA: NEGATIVE
Nitrite, UA: NEGATIVE
Odor: NEGATIVE
Protein, UA: NEGATIVE
Spec Grav, UA: 1.015 (ref 1.010–1.025)
Urobilinogen, UA: 0.2 E.U./dL
pH, UA: 6.5 (ref 5.0–8.0)

## 2020-02-07 MED ORDER — ERGOCALCIFEROL 1.25 MG (50000 UT) PO CAPS: 50000.0000 [IU] | ORAL_CAPSULE | ORAL | 0 refills | Status: DC

## 2020-02-07 NOTE — Patient Instructions (Addendum)
RTC in 6 months for TSH and OV. Drisdol weekly x 12 weeks then take 4000 units Vitamin D3 daily. Take Xanax if needed for anxiety. Watch BP at home. It was a pleasure to see you today.

## 2020-02-07 NOTE — Progress Notes (Deleted)
   Subjective:    Patient ID: Gloria Howell, female    DOB: Jul 31, 1976, 44 y.o.   MRN: [id number]  HPI 44 year old Female for health maintenance exam and evaluation of medical issues.        Review of Systems     Objective:   Physical Exam        Assessment & Plan:

## 2020-02-20 NOTE — Progress Notes (Signed)
   Subjective:    Patient ID: Gloria Howell, female    DOB: 07-Aug-1976, 44 y.o.   MRN: [id number]  HPI 44 year old Female seen today for health maintenance exam and evaluation of medical issues.  He has situational stress, anxiety and depression dealing with 74-year-old son with Angelman syndrome.  He had a sleep disorder and labile behavior at times.  Her mother resides with the family and helps out quite a bit.  Past medical history: History of closed fracture of left ankle in 2018 after missing a step into a wading pool.  She had an oblique fracture through the distal left fibular metaphysis and a distal tibial fracture at the base of the medial malleolus and posterior malleolus.  Ankle mortise was intact.  An episode of acute diverticulitis in 2016 confirmed on CT scan treated with Cipro and Flagyl.  Had similar episode in July 2020 treated with Cipro.  Past medical history: No known drug allergies.  History of fractured pelvis 1996.  Dr. Tressia Danas is GYN physician.  History of Proteus urinary tract infection 2004.  Evaluated for palpitations in 2004 and had rare PVCs and PACs.  Social history: 2 sons ages 84 and 79.  Youngest son has Angelman syndrome.  Married.  Husband is an Chief Financial Officer.  Patient's mother resides with the family.  Family history: Mother in good health.  1 brother in good health.  Review of labs shows low vitamin D level of 25.  Recommend weekly vitamin D supplement 50,000 units for 12 weeks then 4 to 5000 units daily.  Review of Systems situational stress with anxiety due to special needs son.  Has taken Lexapro 10 mg daily.  Also has taken Xanax every 12 hours as needed.     Objective:   Physical Exam Blood pressure 120/90, pulse 101, BMI 28.17 weight 159 pounds pulse oximetry 98% temperature 98.6 degrees orally.  Skin warm and dry.  Nodes none.  Neck is supple without thyromegaly or adenopathy.  Chest clear to auscultation.  Cardiac exam regular rate and rhythm normal  S1 and S2.  Abdomen soft nondistended without hepatosplenomegaly masses or tenderness.  Pelvic exam deferred to GYN physician.  No lower extremity edema or deformity.  Neuro intact without focal deficits.  Thought judgment and affect are normal.       Assessment & Plan:  Situational stress with special needs son and she has been treated with Lexapro and Xanax.  She handles the situation very well.  Husband and mother are supportive.  Vitamin D deficiency-she will take 50,000 units weekly for 12 weeks then 4000 to 5000 units of vitamin D3 daily over-the-counter.  Mild hypertriglyceridemia.  Level is 166.  She has a low HDL of 41.  LDL is 102 and total cholesterol 171.  This is a mild elevation and can be handled with diet and exercise for now.  Plan: Her TSH is 4.18.  I would like to recheck her TSH in 6 months and follow-up with her at that time.  She may be developing hypothyroidism.

## 2020-02-20 NOTE — Progress Notes (Deleted)
   Subjective:    Patient ID: Gloria Howell, female    DOB: 09-19-1976, 44 y.o.   MRN: [id number]  HPI    Review of Systems     Objective:   Physical Exam        Assessment & Plan:

## 2020-07-21 DIAGNOSIS — Z01419 Encounter for gynecological examination (general) (routine) without abnormal findings: Secondary | ICD-10-CM | POA: Diagnosis not present

## 2020-07-21 DIAGNOSIS — Z1231 Encounter for screening mammogram for malignant neoplasm of breast: Secondary | ICD-10-CM | POA: Diagnosis not present

## 2020-07-21 DIAGNOSIS — Z6828 Body mass index (BMI) 28.0-28.9, adult: Secondary | ICD-10-CM | POA: Diagnosis not present

## 2020-08-04 ENCOUNTER — Other Ambulatory Visit: Payer: BC Managed Care – PPO | Admitting: Internal Medicine

## 2020-08-04 ENCOUNTER — Other Ambulatory Visit: Payer: Self-pay

## 2020-08-04 DIAGNOSIS — Z1329 Encounter for screening for other suspected endocrine disorder: Secondary | ICD-10-CM

## 2020-08-04 LAB — TSH: TSH: 2.74 mIU/L

## 2020-08-08 ENCOUNTER — Ambulatory Visit: Payer: BC Managed Care – PPO | Admitting: Internal Medicine

## 2020-08-08 ENCOUNTER — Other Ambulatory Visit: Payer: Self-pay

## 2020-08-08 ENCOUNTER — Encounter: Payer: Self-pay | Admitting: Internal Medicine

## 2020-08-08 VITALS — BP 120/80 | HR 80 | Ht 63.0 in | Wt 162.0 lb

## 2020-08-08 DIAGNOSIS — Z23 Encounter for immunization: Secondary | ICD-10-CM

## 2020-08-08 DIAGNOSIS — R946 Abnormal results of thyroid function studies: Secondary | ICD-10-CM | POA: Diagnosis not present

## 2020-08-08 DIAGNOSIS — Z6828 Body mass index (BMI) 28.0-28.9, adult: Secondary | ICD-10-CM | POA: Diagnosis not present

## 2020-08-08 NOTE — Patient Instructions (Signed)
Flu vaccine given.  Continue with diet and exercise regimen and follow-up here in April 2022.  TSH is within normal limits.

## 2020-08-08 NOTE — Progress Notes (Signed)
   Subjective:    Patient ID: Gloria Howell, female    DOB: 03-May-1976, 44 y.o.   MRN: [id number]  HPI Patient is here today follow-up on TSH.  In April it was slightly elevated at 4.18 normal being up to 4.50 and she had some weight concerns.  We decided to recheck it in 6 months.  It is now down to 3.74.  We will continue to monitor this.  She will be due for physical exam in April 2022.  She saw her GYN recently who gave her phentermine for weight loss.  She took 1 capsule and did not like the way it made her feel.  I offered her Tenuate today but she declines.  We talked about diet and exercise.  She has a busy lifestyle taking care of 2 children.    Review of Systems see above no new complaints     Objective:   Physical Exam Blood pressure 120/80 pulse 80 pulse oximetry 99% weight 162 pounds BMI 28.70       Assessment & Plan:  Patient is not hypothyroid.  TSH is within normal limits.  Discussion about diet exercise and weight loss.  Offered referral to [facility].  She would like to continue to try diet and exercise on her own.  Offered Tenuate but she declined.  Does not tolerate phentermine.  Flu vaccine given  Plan: Return in April for health maintenance exam.

## 2020-09-06 ENCOUNTER — Other Ambulatory Visit: Payer: Self-pay | Admitting: Internal Medicine

## 2021-02-15 ENCOUNTER — Telehealth: Payer: Self-pay

## 2021-02-15 ENCOUNTER — Ambulatory Visit: Payer: BC Managed Care – PPO | Admitting: Internal Medicine

## 2021-02-15 ENCOUNTER — Ambulatory Visit (HOSPITAL_COMMUNITY)
Admission: RE | Admit: 2021-02-15 | Discharge: 2021-02-15 | Disposition: A | Discharge status: Home / Self Care | Payer: BC Managed Care – PPO | Source: Ambulatory Visit | Attending: Internal Medicine | Admitting: Internal Medicine

## 2021-02-15 ENCOUNTER — Encounter: Payer: Self-pay | Admitting: Internal Medicine

## 2021-02-15 ENCOUNTER — Other Ambulatory Visit: Payer: Self-pay

## 2021-02-15 VITALS — BP 130/80 | HR 64 | Temp 98.6°F | Ht 63.0 in | Wt 162.0 lb

## 2021-02-15 DIAGNOSIS — R0989 Other specified symptoms and signs involving the circulatory and respiratory systems: Secondary | ICD-10-CM

## 2021-02-15 DIAGNOSIS — R0981 Nasal congestion: Secondary | ICD-10-CM | POA: Diagnosis not present

## 2021-02-15 DIAGNOSIS — R059 Cough, unspecified: Secondary | ICD-10-CM

## 2021-02-15 DIAGNOSIS — J22 Unspecified acute lower respiratory infection: Secondary | ICD-10-CM | POA: Diagnosis not present

## 2021-02-15 DIAGNOSIS — H6693 Otitis media, unspecified, bilateral: Secondary | ICD-10-CM

## 2021-02-15 LAB — CBC WITH DIFFERENTIAL/PLATELET
Absolute Monocytes: 624 cells/uL (ref 200–950)
Basophils Absolute: 30 cells/uL (ref 0–200)
Basophils Relative: 0.5 %
Eosinophils Absolute: 192 cells/uL (ref 15–500)
Eosinophils Relative: 3.2 %
HCT: 37.3 % (ref 35.0–45.0)
Hemoglobin: 11.9 g/dL (ref 11.7–15.5)
Lymphs Abs: 2352 cells/uL (ref 850–3900)
MCH: 24.7 pg — ABNORMAL LOW (ref 27.0–33.0)
MCHC: 31.9 g/dL — ABNORMAL LOW (ref 32.0–36.0)
MCV: 77.5 fL — ABNORMAL LOW (ref 80.0–100.0)
MPV: 10.2 fL (ref 7.5–12.5)
Monocytes Relative: 10.4 %
Neutro Abs: 2802 cells/uL (ref 1500–7800)
Neutrophils Relative %: 46.7 %
Platelets: 331 10*3/uL (ref 140–400)
RBC: 4.81 10*6/uL (ref 3.80–5.10)
RDW: 14.8 % (ref 11.0–15.0)
Total Lymphocyte: 39.2 %
WBC: 6 10*3/uL (ref 3.8–10.8)

## 2021-02-15 MED ORDER — BENZONATATE 100 MG PO CAPS: 100.0000 mg | ORAL_CAPSULE | Freq: Three times a day (TID) | ORAL | 0 refills | Status: DC | PRN

## 2021-02-15 MED ORDER — CEFTRIAXONE SODIUM 1 G IJ SOLR
1.0000 g | Freq: Once | INTRAMUSCULAR | Status: AC
  Administered 2021-02-15: 1 g via INTRAMUSCULAR

## 2021-02-15 MED ORDER — AZITHROMYCIN 250 MG PO TABS: ORAL_TABLET | ORAL | 0 refills | Status: DC

## 2021-02-15 NOTE — Telephone Encounter (Signed)
Done

## 2021-02-15 NOTE — Patient Instructions (Addendum)
Quarantine at home until test results are back.  Rocephin 1 g IM given in office.  Chest x-ray proved to be negative for pneumonia.  Take Zithromax Z-Pak 2 tabs day 1 followed by 1 tab days 2 through 5.  Tessalon Perles 100 mg 3 times a day as needed for cough and sore throat pain.  Monitor pulse oximetry.

## 2021-02-15 NOTE — Telephone Encounter (Signed)
Needs appt in sick room

## 2021-02-15 NOTE — Telephone Encounter (Signed)
Started 2 weeks ago with allergy like symptoms, now she has a cold head?, cough no fever no chills, some sore throat. One of her kids had walking pneumonia last week. Covid test was negative last night.

## 2021-02-15 NOTE — Progress Notes (Signed)
   Subjective:    Patient ID: Gloria Howell, female    DOB: 1975/11/23, 45 y.o.   MRN: [id number]  HPI Onset 2 weeks ago with allergy type symptoms. One of her children had "walking pneumonia" last week. Home Covid test taken last night was negative.  Respiratory virus panel and COVID-19 test were obtained today.  Also obtain CBC with diff because of prolonged symptoms.  Also wanted her to get chest x-ray since one of her children had "walking pneumonia", she sounded nasally congested and I thought I heard rales in the left lower lobe.  Patient has a history of situational stress, anxiety depression.  She has 2 sons, one has Angelman syndrome.    Review of Systems see above she has had some ear congestion in addition to cough and upper respiratory congestion.  She looks fatigued.     Objective:   Physical Exam Temperature 98.6 degrees.  Pulse 64 regular blood pressure 130/80 pulse oximetry 98% weight 163 pounds BMI 28.70  Skin warm and dry.  She sounds hoarse.  Her pharynx is slightly injected without exudate.  Both TMs are dull and slightly retracted as well as read.  Neck is supple.  No cervical adenopathy.  I think I appreciate fine rales in the left lower lobe.  However, chest x-ray was negative for pneumonia.       Assessment & Plan:  Acute lower respiratory infection  Acute bilateral otitis media  Exposure to "walking pneumonia "from one of her children.  Has had 2 COVID vaccines by our records.  Had flu vaccine in October 2021.  Chest x-ray is negative for pneumonia.  COVID-19 test and respiratory virus panel currently  pending.  Chest x-ray shows no pneumonia.  She was given 1 g IM Rocephin and started on Zithromax Z-Pak 2 tabs day 1 followed by 1 tab days 2 through 5.  Was given Tessalon Perles 100 mg 3 times daily as needed for cough and sore throat pain.  Rest at home and quarantine until these test results are back.  Monitor pulse oximetry.

## 2021-02-15 NOTE — Telephone Encounter (Signed)
Scheduled at 12:30pm. Can you add her to the book? Thanks.

## 2021-02-16 LAB — SARS-COV-2 RNA,(COVID-19) QUALITATIVE NAAT: SARS CoV2 RNA: NOT DETECTED

## 2021-02-19 LAB — RESPIRATORY VIRUS PANEL
Adenovirus B: NOT DETECTED
HUMAN PARAINFLU VIRUS 1: NOT DETECTED
HUMAN PARAINFLU VIRUS 2: NOT DETECTED
HUMAN PARAINFLU VIRUS 3: DETECTED — AB
INFLUENZA A SUBTYPE H1: NOT DETECTED
INFLUENZA A SUBTYPE H3: NOT DETECTED
Influenza A: NOT DETECTED
Influenza B: NOT DETECTED
Metapneumovirus: NOT DETECTED
Respiratory Syncytial Virus A: NOT DETECTED
Respiratory Syncytial Virus B: NOT DETECTED
Rhinovirus: NOT DETECTED

## 2021-04-05 ENCOUNTER — Other Ambulatory Visit: Payer: Self-pay | Admitting: Internal Medicine

## 2021-04-05 MED ORDER — ALPRAZOLAM 0.25 MG PO TABS: ORAL_TABLET | ORAL | 2 refills | Status: AC

## 2021-04-05 NOTE — Telephone Encounter (Signed)
Received Fax RX request from  [facility] [place], [place] - [address] Phone:  [phone]  Fax:  [phone]      Medication - ALPRAZolam Duanne Moron) 0.25 MG tablet  Last Refill -   Last OV - 02/15/21  Last CPE - 02/07/20  Next Appointment - LVM to Call office and schedule CPE for next available appointment.

## 2021-04-05 NOTE — Telephone Encounter (Signed)
Pended please sign

## 2021-06-28 ENCOUNTER — Other Ambulatory Visit: Payer: Self-pay

## 2021-06-28 ENCOUNTER — Other Ambulatory Visit: Payer: BC Managed Care – PPO | Admitting: Internal Medicine

## 2021-06-28 DIAGNOSIS — E559 Vitamin D deficiency, unspecified: Secondary | ICD-10-CM

## 2021-06-28 DIAGNOSIS — Z0000 Encounter for general adult medical examination without abnormal findings: Secondary | ICD-10-CM | POA: Diagnosis not present

## 2021-06-28 DIAGNOSIS — E781 Pure hyperglyceridemia: Secondary | ICD-10-CM

## 2021-06-28 DIAGNOSIS — F411 Generalized anxiety disorder: Secondary | ICD-10-CM | POA: Diagnosis not present

## 2021-06-29 LAB — CBC WITH DIFFERENTIAL/PLATELET
Absolute Monocytes: 576 cells/uL (ref 200–950)
Basophils Absolute: 29 cells/uL (ref 0–200)
Basophils Relative: 0.4 %
Eosinophils Absolute: 79 cells/uL (ref 15–500)
Eosinophils Relative: 1.1 %
HCT: 38.1 % (ref 35.0–45.0)
Hemoglobin: 12.1 g/dL (ref 11.7–15.5)
Lymphs Abs: 2426 cells/uL (ref 850–3900)
MCH: 24.4 pg — ABNORMAL LOW (ref 27.0–33.0)
MCHC: 31.8 g/dL — ABNORMAL LOW (ref 32.0–36.0)
MCV: 77 fL — ABNORMAL LOW (ref 80.0–100.0)
MPV: 10.4 fL (ref 7.5–12.5)
Monocytes Relative: 8 %
Neutro Abs: 4090 cells/uL (ref 1500–7800)
Neutrophils Relative %: 56.8 %
Platelets: 292 10*3/uL (ref 140–400)
RBC: 4.95 10*6/uL (ref 3.80–5.10)
RDW: 15.3 % — ABNORMAL HIGH (ref 11.0–15.0)
Total Lymphocyte: 33.7 %
WBC: 7.2 10*3/uL (ref 3.8–10.8)

## 2021-06-29 LAB — COMPLETE METABOLIC PANEL WITH GFR
AG Ratio: 1.6 (calc) (ref 1.0–2.5)
ALT: 20 U/L (ref 6–29)
AST: 21 U/L (ref 10–30)
Albumin: 4.4 g/dL (ref 3.6–5.1)
Alkaline phosphatase (APISO): 39 U/L (ref 31–125)
BUN: 13 mg/dL (ref 7–25)
CO2: 25 mmol/L (ref 20–32)
Calcium: 9 mg/dL (ref 8.6–10.2)
Chloride: 106 mmol/L (ref 98–110)
Creat: 0.71 mg/dL (ref 0.50–0.99)
Globulin: 2.7 g/dL (calc) (ref 1.9–3.7)
Glucose, Bld: 86 mg/dL (ref 65–99)
Potassium: 5 mmol/L (ref 3.5–5.3)
Sodium: 137 mmol/L (ref 135–146)
Total Bilirubin: 0.5 mg/dL (ref 0.2–1.2)
Total Protein: 7.1 g/dL (ref 6.1–8.1)
eGFR: 107 mL/min/{1.73_m2} (ref >=60)

## 2021-06-29 LAB — TSH: TSH: 2.91 mIU/L

## 2021-06-29 LAB — LIPID PANEL
Cholesterol: 179 mg/dL (ref <200)
HDL: 40 mg/dL — ABNORMAL LOW (ref >=50)
LDL Cholesterol (Calc): 111 mg/dL (calc) — ABNORMAL HIGH
Non-HDL Cholesterol (Calc): 139 mg/dL (calc) — ABNORMAL HIGH (ref <130)
Total CHOL/HDL Ratio: 4.5 (calc) (ref <5.0)
Triglycerides: 165 mg/dL — ABNORMAL HIGH (ref <150)

## 2021-07-09 ENCOUNTER — Telehealth: Payer: Self-pay | Admitting: Internal Medicine

## 2021-07-09 NOTE — Telephone Encounter (Signed)
Cancel CPE, put her on wait list, she does have appointment with GYN in October. Will call if she needs anything.

## 2021-07-09 NOTE — Telephone Encounter (Signed)
Gloria Howell [phone]  Star called to say she was diagnosed with COVID on 07/05/2021, she has her CPE on 07/12/2021, will she be alright to come in then as she wears a mask. She has a little congestion in her head now.

## 2021-07-12 ENCOUNTER — Encounter: Payer: BC Managed Care – PPO | Admitting: Internal Medicine

## 2021-07-16 NOTE — Telephone Encounter (Signed)
scheduled

## 2021-07-20 ENCOUNTER — Other Ambulatory Visit: Payer: Self-pay | Admitting: Internal Medicine

## 2021-07-20 ENCOUNTER — Ambulatory Visit: Payer: BC Managed Care – PPO | Admitting: Internal Medicine

## 2021-07-20 ENCOUNTER — Other Ambulatory Visit: Payer: Self-pay

## 2021-07-20 VITALS — BP 128/70 | HR 71 | Temp 98.2°F | Resp 16 | Ht 63.0 in | Wt 157.0 lb

## 2021-07-20 DIAGNOSIS — R790 Abnormal level of blood mineral: Secondary | ICD-10-CM | POA: Diagnosis not present

## 2021-07-20 DIAGNOSIS — R718 Other abnormality of red blood cells: Secondary | ICD-10-CM | POA: Diagnosis not present

## 2021-07-20 DIAGNOSIS — Z23 Encounter for immunization: Secondary | ICD-10-CM

## 2021-07-20 DIAGNOSIS — D509 Iron deficiency anemia, unspecified: Secondary | ICD-10-CM | POA: Diagnosis not present

## 2021-07-20 DIAGNOSIS — N920 Excessive and frequent menstruation with regular cycle: Secondary | ICD-10-CM | POA: Diagnosis not present

## 2021-07-20 DIAGNOSIS — E611 Iron deficiency: Secondary | ICD-10-CM

## 2021-07-20 NOTE — Progress Notes (Signed)
   Subjective:    Patient ID: Gloria Howell, female    DOB: 1976/07/04, 45 y.o.   MRN: [id number]  HPI  45 year old Female  here for discussion regarding recent lab results. Had Covid  in early September.  Has upcoming GYN exam. Although she is not anemic the size of her red cells is small (MCV) at 77.5.  We are going to check for iron deficiency.  Had mild case of COVID.  Basically feels well.   Review of Systems has been trying to eat more healthy. Having heavy periods.     Objective:   Physical Exam Blood pressure 128/70 pulse 71 respiratory rate 16 pulse oximetry 98% weight 157 pounds BMI 27.81  No thyromegaly.  Chest clear to auscultation.  Cardiac exam regular rate and rhythm.       Assessment & Plan:  Heavy menses resulting in iron deficiency.  Ferritin is 5.  Her serum iron level is low normal at 73.  MCV 77.0 Percent saturation is 16% at the lower end of normal.  Hemoglobin normal at 12.1 grams but MCV is less than 80 consistent with iron deficiency.:  I think she would benefit from iron sulfate 325 mg twice daily over-the-counter.  We can follow-up in January at time of her health maintenance exam.  In early September, she had canceled her physical due to [place].  Triglycerides were slightly elevated at 165 and LDL cholesterol 111 with total cholesterol of 179.  HDL low at 40.  Continue diet and exercise efforts.   Glucose, BUN and creatinine are normal  TSH normal.  Flu vaccine given.   I called patient on Sept 24th.Patient will take iron sulfate 325 mg OTC twice daily if tolerated and will follow up in January. She will discuss heavy menses with GYN.

## 2021-07-20 NOTE — Patient Instructions (Signed)
Flu vaccine given. Iron/TIBC  and ferritin drawn. Will review results and contact patient.

## 2021-07-21 ENCOUNTER — Encounter: Payer: Self-pay | Admitting: Internal Medicine

## 2021-07-21 LAB — IRON, TOTAL/TOTAL IRON BINDING CAP
%SAT: 16 % (calc) (ref 16–45)
Iron: 73 ug/dL (ref 40–190)
TIBC: 467 mcg/dL (calc) — ABNORMAL HIGH (ref 250–450)

## 2021-07-21 LAB — FERRITIN: Ferritin: 5 ng/mL — ABNORMAL LOW (ref 16–232)

## 2021-07-31 DIAGNOSIS — Z1231 Encounter for screening mammogram for malignant neoplasm of breast: Secondary | ICD-10-CM | POA: Diagnosis not present

## 2021-08-22 DIAGNOSIS — N920 Excessive and frequent menstruation with regular cycle: Secondary | ICD-10-CM | POA: Diagnosis not present

## 2021-08-22 DIAGNOSIS — Z01419 Encounter for gynecological examination (general) (routine) without abnormal findings: Secondary | ICD-10-CM | POA: Diagnosis not present

## 2021-08-22 DIAGNOSIS — Z6827 Body mass index (BMI) 27.0-27.9, adult: Secondary | ICD-10-CM | POA: Diagnosis not present

## 2021-08-24 ENCOUNTER — Ambulatory Visit: Payer: BC Managed Care – PPO | Admitting: Internal Medicine

## 2021-08-24 ENCOUNTER — Other Ambulatory Visit: Payer: Self-pay

## 2021-08-24 ENCOUNTER — Telehealth: Payer: Self-pay | Admitting: Internal Medicine

## 2021-08-24 DIAGNOSIS — J069 Acute upper respiratory infection, unspecified: Secondary | ICD-10-CM | POA: Diagnosis not present

## 2021-08-24 DIAGNOSIS — Z8616 Personal history of COVID-19: Secondary | ICD-10-CM

## 2021-08-24 DIAGNOSIS — J029 Acute pharyngitis, unspecified: Secondary | ICD-10-CM | POA: Diagnosis not present

## 2021-08-24 LAB — POCT RAPID STREP A (OFFICE): Rapid Strep A Screen: NEGATIVE

## 2021-08-24 MED ORDER — FLUCONAZOLE 150 MG PO TABS: 150.0000 mg | ORAL_TABLET | Freq: Once | ORAL | 0 refills | Status: AC

## 2021-08-24 MED ORDER — AZITHROMYCIN 250 MG PO TABS
ORAL_TABLET | ORAL | 0 refills | Status: AC
Start: 2021-08-24 — End: 2021-08-29

## 2021-08-24 NOTE — Telephone Encounter (Signed)
COVID test negative, scheduled car visit

## 2021-08-24 NOTE — Telephone Encounter (Signed)
Gloria Howell [phone]  [name] called to say she started getting sick on Monday with and it has gotten worse thru out the week, she has sore throat, body aches, stuffy head, sinus congestion, joints achy, drainage, and little cough, she done COVID test on Monday that was negative. I have ask her to do another one today and call me back with results. She has had at least 2 COVID vaccines. She would like to see if she has strep throat.

## 2021-08-25 ENCOUNTER — Encounter: Payer: Self-pay | Admitting: Internal Medicine

## 2021-08-25 NOTE — Progress Notes (Signed)
   Subjective:    Patient ID: Gloria Howell, female    DOB: Aug 28, 1976, 45 y.o.   MRN: [id number]  HPI 45 year old Female who has come down with sore throat, myalgias, stuffy head, sinus congestion and a little bit of cough.  Has done a couple of COVID test at home and they have been negative.  She has had COVID vaccines (at least 2 by our records) last one in 2021 by our records.  1 child has had some respiratory issues recently.  She is interested in being tested for strep throat.  She did have COVID-19 in early September.    Review of Systems none shaking chills.     Objective:   Physical Exam She is afebrile.  TMs are clear.  Pharynx is slightly injected and no exudate is noted.  Rapid strep screen is negative.  Chest is clear to auscultation.       Assessment & Plan:  Acute upper respiratory infection.  She is interested in having antibiotic for this.  Have prescribed Zithromax Z-PAK with directions 2 tabs day 1 followed by 1 tab days 2 through 5.  Diflucan if needed for Candida vaginitis with antibiotic treatment.

## 2021-08-25 NOTE — Patient Instructions (Signed)
You have tested negative for strep throat.  You had COVID-19 in September so I doubt you have it again this early.  Your COVID test at home with been negative and we have not repeated those today.  For respiratory congestion I have prescribed Zithromax Z-PAK 2 tabs day 1 followed by 1 tab days 2 through 5.  I have also sent in a prescription for Diflucan if needed for Candida vaginitis while on antibiotics.  Rest and drink fluids.  Call if not improving in a few days.

## 2021-10-17 ENCOUNTER — Encounter: Payer: Self-pay | Admitting: Internal Medicine

## 2021-10-17 ENCOUNTER — Ambulatory Visit (INDEPENDENT_AMBULATORY_CARE_PROVIDER_SITE_OTHER): Payer: BC Managed Care – PPO | Admitting: Internal Medicine

## 2021-10-17 ENCOUNTER — Other Ambulatory Visit: Payer: Self-pay

## 2021-10-17 ENCOUNTER — Telehealth: Payer: Self-pay | Admitting: Internal Medicine

## 2021-10-17 VITALS — BP 132/78 | HR 78 | Temp 97.8°F

## 2021-10-17 DIAGNOSIS — B9689 Other specified bacterial agents as the cause of diseases classified elsewhere: Secondary | ICD-10-CM | POA: Diagnosis not present

## 2021-10-17 DIAGNOSIS — J028 Acute pharyngitis due to other specified organisms: Secondary | ICD-10-CM

## 2021-10-17 DIAGNOSIS — J029 Acute pharyngitis, unspecified: Secondary | ICD-10-CM

## 2021-10-17 LAB — POCT RAPID STREP A (OFFICE): Rapid Strep A Screen: NEGATIVE

## 2021-10-17 MED ORDER — FLUCONAZOLE 150 MG PO TABS: 150.0000 mg | ORAL_TABLET | Freq: Once | ORAL | 0 refills | Status: AC

## 2021-10-17 MED ORDER — AZITHROMYCIN 250 MG PO TABS: ORAL_TABLET | ORAL | 0 refills | Status: AC

## 2021-10-17 NOTE — Telephone Encounter (Signed)
Gloria Howell [phone]  Quina called to say her throat hurts really bad on the left side and she has red splotches, she also feels her glands are swollen and it hurts to move her neck., She don't feel just right.No fever. She is going to do COVID test and call me back with results. She would like to be tested for Strep Throat,

## 2021-10-17 NOTE — Telephone Encounter (Signed)
scheduled

## 2021-10-17 NOTE — Telephone Encounter (Signed)
COVID test Negative

## 2021-10-17 NOTE — Progress Notes (Signed)
° °  Subjective:    Patient ID: Gloria Howell, female    DOB: 1976-04-10, 45 y.o.   MRN: [id number]  HPI 45 year old Female in good general health went to [facility] by airplane this past weekend to visit relatives.  Was exposed to a small child who may have strep throat.  Patient complaining of left-sided sore throat.  Has noted red splotches in her throat and feels that her glands are swollen.  Denies fever.  She took a COVID test at home and it was negative.    Review of Systems no shaking chills, nausea, vomiting, dysgeusia or significant myalgias.  Complains of left ear pain.     Objective:   Physical Exam  Temperature 97.8 degrees blood pressure 132/78 pulse 78 regular pulse oximetry 97% She has red macular blotches on her soft palate.  Her pharynx is red without exudate.  Rapid strep screen was negative.  Right TM is slightly dull but not red.  Left TM is obscured by cerumen but tender to touch.  Her neck is supple.  Bilateral cervical adenopathy with tender lymph nodes on palpation.     Assessment & Plan:  Probable strep pharyngitis given appearance of her throat.  There is no exudate present but she has splotches on her soft palate that are frequently seen in strep throat patients.  She will be treated with Zithromax Z-PAK 2 tabs day 1 followed by 1 tab days 2 through 5.  Rest and drink fluids.  May take Diflucan 150 mg tablet if she develops Candida vaginitis while on antibiotics.

## 2021-10-17 NOTE — Patient Instructions (Addendum)
Rest and drink plenty of fluids.  Take Zithromax Z-PAK 2 tabs day 1 followed by 1 tab days 2 through 5.  Your rapid strep test is negative but you do indeed have a red throat and have been exposed to someone with strep throat.  May take Diflucan if you get Candida vaginitis while on antibiotics.

## 2021-11-01 ENCOUNTER — Encounter: Payer: Self-pay | Admitting: Internal Medicine

## 2021-11-01 ENCOUNTER — Ambulatory Visit (INDEPENDENT_AMBULATORY_CARE_PROVIDER_SITE_OTHER): Payer: BC Managed Care – PPO | Admitting: Internal Medicine

## 2021-11-01 ENCOUNTER — Other Ambulatory Visit: Payer: Self-pay

## 2021-11-01 VITALS — BP 122/70 | HR 90 | Temp 99.0°F | Ht 64.0 in | Wt 159.0 lb

## 2021-11-01 DIAGNOSIS — E611 Iron deficiency: Secondary | ICD-10-CM

## 2021-11-01 DIAGNOSIS — Z0000 Encounter for general adult medical examination without abnormal findings: Secondary | ICD-10-CM

## 2021-11-01 DIAGNOSIS — F411 Generalized anxiety disorder: Secondary | ICD-10-CM

## 2021-11-01 DIAGNOSIS — F439 Reaction to severe stress, unspecified: Secondary | ICD-10-CM | POA: Diagnosis not present

## 2021-11-01 LAB — POCT URINALYSIS DIPSTICK
Bilirubin, UA: NEGATIVE
Blood, UA: NEGATIVE
Glucose, UA: NEGATIVE
Ketones, UA: NEGATIVE
Leukocytes, UA: NEGATIVE
Nitrite, UA: NEGATIVE
Protein, UA: POSITIVE — AB
Spec Grav, UA: <=1.005 — AB (ref 1.010–1.025)
Urobilinogen, UA: 0.2 E.U./dL
pH, UA: 8.5 — AB (ref 5.0–8.0)

## 2021-11-01 NOTE — Progress Notes (Signed)
Subjective:    Patient ID: Gloria Howell, female    DOB: 05/09/1976, 46 y.o.   MRN: [id number]  HPI 46 year old Female seen  for health maintenance exam and evaluation of medical issues.  In September 2022, she had a ferritin level of 5 and serum iron level which was normal at 73.  Over-the-counter iron supplement has been recommended and she will return in May to have this followed up.  She has situational stress, anxiety and depression dealing with young son with Angelman syndrome.  He has a sleep disorder and labile behavior at times.  Her mother is supportive and helpful to her.  Past medical history: History of closed fracture of the left ankle in 2018 after missing a step into a waiting pool.  She had an oblique fracture through the distal left fibular metaphysis and a distal tubular fracture at the base of the medial malleolus and posterior malleolus.  Ankle mortise was intact.  Episode of acute diverticulitis in 2016 confirmed on CT scan treated with Cipro and Flagyl.  Had similar episode July 2020 treated with Cipro.  Past medical history: No known drug allergies.  Fractured pelvis 1996.  Dr. Rolin Barry GYN physician.  History of Proteus urinary infection 2004.  Evaluated for palpitations in 2004 and had rare PVCs and PACs.  Family history: Mother in good health.  1 brother in good health.  Social history: 2 sons ages 17 and 20.  Husband is an Chief Financial Officer.  Mother resides with the family.  History of vitamin D deficiency and has been maintained on high-dose vitamin D in 2021.  Her level was 25 at the time.  She is taking vitamin D supplementation at this time.  Level was not checked today due to expense.  Recommend 5000 units daily.  For situational stress and anxiety she has Xanax on hand at low-dose 0.25 mg to take twice daily if needed and she also takes Lexapro 10 mg daily.  Urine protein was positive on dipstick but may not be accurate.  It had been negative in the past.   Her specific gravity was quite low at less than 1.005.  She obviously was well-hydrated before coming to the office.  I am wondering if this impacted the urine protein result but you would think it would dilute the protein and make it negative instead of positive.  I think this can be repeated at another time.  Her BUN and creatinine are normal and unable no reason for her to have significant proteinuria.  Has not been exercising or overexerting herself today.  Review of Systems Mammogram done at GYN office.  No other complaints.     Objective:   Physical Exam Blood pressure 122/70, pulse 90 and regular Temperature 99 degrees ,pulse oximetry 97% on room air, Weight 159 pounds, BMI 27.29 Skin is warm and dry.  No cervical adenopathy or thyromegaly.  TMs clear.  Neck supple.  Chest is clear to auscultation.  Cardiac exam: Regular rate and rhythm without ectopy.  Abdomen soft nondistended without hepatosplenomegaly masses or tenderness.  GYN exam deferred to GYN physician.  No lower extremity pitting edema.  Neuro is intact without gross focal deficits.  Affect thought and judgment are normal.      Assessment & Plan:  Anxiety treated with Lexapro and as needed Xanax.  This is due to situational stress.  She manages it well very well.  Low urine specific gravity.  I think she probably was well-hydrated when she came  in today or this could be a lab error.  Urine pH was 8.5 which is a bit high this is on and suspect this is also a lab error.  Urine protein was positive but she had not been exercising.  She can return in a few weeks if she so desires and have this rechecked.  Would be happy to send a specimen off to our reference lab in the near future if she so desires.  Plan:

## 2021-11-01 NOTE — Patient Instructions (Addendum)
It was a pleasure to see you today. Discussed screening colonoscopy.  Recommend return in May for follow-up on iron deficiency and may do urine dipstick at that time.  It is my feeling that the urine dipstick results were erroneous.  She will take over-the-counter iron supplementation.  She will continue Xanax as needed and continue with Lexapro daily

## 2022-01-02 ENCOUNTER — Telehealth: Payer: Self-pay | Admitting: Internal Medicine

## 2022-01-02 NOTE — Telephone Encounter (Signed)
Gloria Howell ?[phone] ? ?[name] called back to say her insurance does cover for her to have a colonoscopy. ?So she would like a referral. ?

## 2022-01-03 NOTE — Telephone Encounter (Signed)
Referral placed.

## 2022-01-09 ENCOUNTER — Encounter: Payer: Self-pay | Admitting: Gastroenterology

## 2022-01-14 ENCOUNTER — Ambulatory Visit (AMBULATORY_SURGERY_CENTER): Payer: BC Managed Care – PPO | Admitting: *Deleted

## 2022-01-14 ENCOUNTER — Other Ambulatory Visit: Payer: Self-pay

## 2022-01-14 VITALS — Ht 64.0 in | Wt 160.0 lb

## 2022-01-14 DIAGNOSIS — Z1211 Encounter for screening for malignant neoplasm of colon: Secondary | ICD-10-CM

## 2022-01-14 NOTE — Progress Notes (Signed)

## 2022-01-25 ENCOUNTER — Encounter: Payer: Self-pay | Admitting: Internal Medicine

## 2022-01-25 ENCOUNTER — Ambulatory Visit: Payer: BC Managed Care – PPO | Admitting: Internal Medicine

## 2022-01-25 ENCOUNTER — Encounter: Payer: Self-pay | Admitting: Gastroenterology

## 2022-01-25 ENCOUNTER — Telehealth: Payer: Self-pay

## 2022-01-25 VITALS — BP 118/80 | HR 82 | Temp 99.1°F

## 2022-01-25 DIAGNOSIS — R059 Cough, unspecified: Secondary | ICD-10-CM | POA: Diagnosis not present

## 2022-01-25 DIAGNOSIS — R509 Fever, unspecified: Secondary | ICD-10-CM

## 2022-01-25 MED ORDER — BENZONATATE 100 MG PO CAPS: 100.0000 mg | ORAL_CAPSULE | Freq: Three times a day (TID) | ORAL | 0 refills | Status: DC | PRN

## 2022-01-25 MED ORDER — FLUCONAZOLE 150 MG PO TABS: 150.0000 mg | ORAL_TABLET | Freq: Once | ORAL | 0 refills | Status: AC

## 2022-01-25 MED ORDER — AZITHROMYCIN 250 MG PO TABS: ORAL_TABLET | ORAL | 0 refills | Status: DC

## 2022-01-25 NOTE — Progress Notes (Signed)
SUBJECTIVE:  ?Gloria Howell is a 46 y.o. Female who complains of congestion, sore throat, post nasal drip, dry cough, productive cough, myalgias, headache, and fever for 7 days days. She denies anorexia, chest pain, dizziness, nausea, and vomiting and does not a history of asthma. Patient does not smoke cigarettes. She has malaise and fatigue. No one else at home is ill. Our records only indicate 2 Covid vaccines.Covid test at home today is negative. Has chest congestion, cough and runny nose that started about a week ago. Onset of low grade fever yesterday. ?  ?OBJECTIVE: ?She appears well, vital signs are as noted. T 99.1 degrees.TMs not red.  Pharynx slightly injected. Neck supple. No adenopathy in the neck. Nose is congested. Sinuses non tender. The chest is clear, without wheezes or rales.She has a deep congested cough ? ?ASSESSMENT:  ?Acute lower respiratory infection ? ?PLAN: ?Zithromax Z-PAK 2 tabs day 1 followed by 1 tab days 2 through 5.  Diflucan if needed for Candida vaginitis while on antibiotics.  Tessalon Perles 100 mg to take up to 3 times daily as needed for cough.  Rest and drink fluids. ? ?If on Monday, April 3 you still have respiratory symptoms, consider canceling your colonoscopy for that week.  Respiratory virus panel and COVID-19 testing done today. ? ?Covid PCR and Respiratory virus panel pending. ? ? ?I, Elby Showers, MD, have reviewed all documentation for this visit. The documentation on 01/25/22 for the exam, diagnosis, procedures, and orders are all accurate and complete. ?

## 2022-01-25 NOTE — Patient Instructions (Signed)
Zithromax Z-PAK 2 tabs day 1 followed by 1 tab days 2 through 5.  May take Diflucan if needed for Candida vaginitis.  Tessalon Perles 100 mg to take up to 3 times daily as needed for cough.  Rest and drink plenty of fluids.  If you are still not feeling well on Monday, April 3, please call [facility] and cancel your colonoscopy for later that week.  Respiratory virus panel and COVID-19 PCR testing done today. ?

## 2022-01-25 NOTE — Telephone Encounter (Addendum)
Called and scheduled patient for office visit, she stated that she is to have colonoscopy next week, so that is why she is worried. She did also say she can not smell anything, however she did do COVID test this morning that was negative. Advised patient to wear mask. ?

## 2022-01-25 NOTE — Telephone Encounter (Signed)
Patient called for appointment. She has congestion, cough, runny nose x 1 week, fever started yesterday. She has taken 2 covid tests both negative with the last one being this am. Please advise.  ?

## 2022-01-29 ENCOUNTER — Encounter: Payer: Self-pay | Admitting: Gastroenterology

## 2022-01-29 ENCOUNTER — Ambulatory Visit (AMBULATORY_SURGERY_CENTER): Payer: BC Managed Care – PPO | Admitting: Gastroenterology

## 2022-01-29 VITALS — BP 132/74 | HR 71 | Temp 98.9°F | Resp 13 | Ht 64.0 in | Wt 160.0 lb

## 2022-01-29 DIAGNOSIS — Z1211 Encounter for screening for malignant neoplasm of colon: Secondary | ICD-10-CM | POA: Diagnosis not present

## 2022-01-29 LAB — SARS-COV-2 RNA (COVID-19) RESP VIRAL PNL QL NAAT
Adenovirus B: NOT DETECTED
HUMAN PARAINFLU VIRUS 1: NOT DETECTED
HUMAN PARAINFLU VIRUS 2: NOT DETECTED
HUMAN PARAINFLU VIRUS 3: NOT DETECTED
INFLUENZA A SUBTYPE H1: NOT DETECTED
INFLUENZA A SUBTYPE H3: NOT DETECTED
Influenza A: NOT DETECTED
Influenza B: NOT DETECTED
Metapneumovirus: NOT DETECTED
Respiratory Syncytial Virus A: NOT DETECTED
Respiratory Syncytial Virus B: NOT DETECTED
Rhinovirus: DETECTED — AB
SARS CoV2 RNA: NOT DETECTED

## 2022-01-29 MED ORDER — SODIUM CHLORIDE 0.9 % IV SOLN: 500.0000 mL | Freq: Once | INTRAVENOUS | Status: DC

## 2022-01-29 NOTE — Progress Notes (Signed)
Pt awake, report to RN, VVS  °

## 2022-01-29 NOTE — Patient Instructions (Signed)
Please read handouts provided. ?Continue present medications. ?High Fiber Diet. ?Use FiberCon 1-2 tablets daily. ?Repeat colonoscopy in 10 years for screening. ? ? ?YOU HAD AN ENDOSCOPIC PROCEDURE TODAY AT [facility]:   Refer to the procedure report that was given to you for any specific questions about what was found during the examination.  If the procedure report does not answer your questions, please call your gastroenterologist to clarify.  If you requested that your care partner not be given the details of your procedure findings, then the procedure report has been included in a sealed envelope for you to review at your convenience later. ? ?YOU SHOULD EXPECT: Some feelings of bloating in the abdomen. Passage of more gas than usual.  Walking can help get rid of the air that was put into your GI tract during the procedure and reduce the bloating. If you had a lower endoscopy (such as a colonoscopy or flexible sigmoidoscopy) you may notice spotting of blood in your stool or on the toilet paper. If you underwent a bowel prep for your procedure, you may not have a normal bowel movement for a few days. ? ?Please Note:  You might notice some irritation and congestion in your nose or some drainage.  This is from the oxygen used during your procedure.  There is no need for concern and it should clear up in a day or so. ? ?SYMPTOMS TO REPORT IMMEDIATELY: ? ?Following lower endoscopy (colonoscopy or flexible sigmoidoscopy): ? Excessive amounts of blood in the stool ? Significant tenderness or worsening of abdominal pains ? Swelling of the abdomen that is new, acute ? Fever of 100?F or higher ? ? ? ?For urgent or emergent issues, a gastroenterologist can be reached at any hour by calling [phone]. ?Do not use MyChart messaging for urgent concerns.  ? ? ?DIET:  We do recommend a small meal at first, but then you may proceed to your regular diet.  Drink plenty of fluids but you should avoid  alcoholic beverages for 24 hours. ? ?ACTIVITY:  You should plan to take it easy for the rest of today and you should NOT DRIVE or use heavy machinery until tomorrow (because of the sedation medicines used during the test).   ? ?FOLLOW UP: ?Our staff will call the number listed on your records 48-72 hours following your procedure to check on you and address any questions or concerns that you may have regarding the information given to you following your procedure. If we do not reach you, we will leave a message.  We will attempt to reach you two times.  During this call, we will ask if you have developed any symptoms of COVID 19. If you develop any symptoms (ie: fever, flu-like symptoms, shortness of breath, cough etc.) before then, please call [phone].  If you test positive for Covid 19 in the 2 weeks post procedure, please call and report this information to [place].   ? ?If any biopsies were taken you will be contacted by phone or by letter within the next 1-3 weeks.  Please call us at [phone] if you have not heard about the biopsies in 3 weeks.  ? ? ?SIGNATURES/CONFIDENTIALITY: ?You and/or your care partner have signed paperwork which will be entered into your electronic medical record.  These signatures attest to the fact that that the information above on your After Visit Summary has been reviewed and is understood.  Full responsibility of the confidentiality of this discharge information  lies with you and/or your care-partner.  ?

## 2022-01-29 NOTE — Progress Notes (Signed)
Pt's states no medical or surgical changes since previsit or office visit.  VS CW  
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
## 2022-01-29 NOTE — Progress Notes (Signed)
Coughing throughout procedure ?

## 2022-01-29 NOTE — Op Note (Signed)
[facility] ?Patient Name: Gloria Howell ?Procedure Date: 01/29/2022 10:39 AM ?MRN: [id number] ?Endoscopist: Justice Britain , MD ?Age: 46 ?Referring MD:  ?Date of Birth: June 21, 1976 ?Gender: Female ?Account #: [id number] ?Procedure:                Colonoscopy ?Indications:              Screening for colorectal malignant neoplasm, This  ?                          is the patient's first colonoscopy ?Medicines:                Monitored Anesthesia Care ?Procedure:                Pre-Anesthesia Assessment: ?                          - Prior to the procedure, a History and Physical  ?                          was performed, and patient medications and  ?                          allergies were reviewed. The patient's tolerance of  ?                          previous anesthesia was also reviewed. The risks  ?                          and benefits of the procedure and the sedation  ?                          options and risks were discussed with the patient.  ?                          All questions were answered, and informed consent  ?                          was obtained. Prior Anticoagulants: The patient has  ?                          taken no previous anticoagulant or antiplatelet  ?                          agents. ASA Grade Assessment: II - A patient with  ?                          mild systemic disease. After reviewing the risks  ?                          and benefits, the patient was deemed in  ?                          satisfactory condition to undergo the procedure. ?  After obtaining informed consent, the colonoscope  ?                          was passed under direct vision. Throughout the  ?                          procedure, the patient's blood pressure, pulse, and  ?                          oxygen saturations were monitored continuously. The  ?                          CF [id number] #[id number] was introduced through the anus  ?                          and advanced to  the 5 cm into the ileum. The  ?                          colonoscopy was performed without difficulty. The  ?                          patient tolerated the procedure. The quality of the  ?                          bowel preparation was good. The ileocecal valve,  ?                          appendiceal orifice, and rectum were photographed. ?Scope In: 11:47:46 AM ?Scope Out: 11:57:59 AM ?Scope Withdrawal Time: 0 hours 7 minutes 17 seconds  ?Total Procedure Duration: 0 hours 10 minutes 13 seconds  ?Findings:                 The digital rectal exam findings include  ?                          hemorrhoids. Pertinent negatives include no  ?                          palpable rectal lesions. ?                          Normal mucosa was found in the entire colon. ?                          Non-bleeding non-thrombosed internal hemorrhoids  ?                          were found during retroflexion, during perianal  ?                          exam and during digital exam. The hemorrhoids were  ?                          Grade II (internal hemorrhoids that prolapse but  ?  reduce spontaneously). ?Complications:            No immediate complications. ?Estimated Blood Loss:     Estimated blood loss: none. ?Impression:               - Hemorrhoids found on digital rectal exam. ?                          - Normal mucosa in the entire examined colon. ?                          - Non-bleeding non-thrombosed internal hemorrhoids. ?Recommendation:           - The patient will be observed post-procedure,  ?                          until all discharge criteria are met. ?                          - Discharge patient to home. ?                          - Patient has a contact number available for  ?                          emergencies. The signs and symptoms of potential  ?                          delayed complications were discussed with the  ?                          patient. Return to normal activities  tomorrow.  ?                          Written discharge instructions were provided to the  ?                          patient. ?                          - High fiber diet. ?                          - Use FiberCon 1-2 tablets PO daily. ?                          - Continue present medications. ?                          - Repeat colonoscopy in 10 years for screening  ?                          purposes. ?                          - The findings and recommendations were discussed  ?  with the patient. ?                          - The findings and recommendations were discussed  ?                          with the patient's family. ?Justice Britain, MD ?01/29/2022 12:01:36 PM ?

## 2022-01-31 ENCOUNTER — Telehealth: Payer: Self-pay

## 2022-01-31 NOTE — Telephone Encounter (Signed)
?  Follow up Call- ? ? ?  01/29/2022  ? 10:53 AM  ?Call back number  ?Post procedure Call Back phone  # [phone]  ?Permission to leave phone message Yes  ?  ? ?Patient questions: ? ?Do you have a fever, pain , or abdominal swelling? No. ?Pain Score  0 * ? ?Have you tolerated food without any problems? Yes.   ? ?Have you been able to return to your normal activities? Yes.   ? ?Do you have any questions about your discharge instructions: ?Diet   No. ?Medications  No. ?Follow up visit  No. ? ?Do you have questions or concerns about your Care? No. ? ?Actions: ?* If pain score is 4 or above: ?No action needed, pain <4. ? ? ?

## 2022-03-07 ENCOUNTER — Other Ambulatory Visit: Payer: BC Managed Care – PPO

## 2022-03-07 DIAGNOSIS — E611 Iron deficiency: Secondary | ICD-10-CM

## 2022-03-08 ENCOUNTER — Telehealth: Payer: Self-pay | Admitting: Internal Medicine

## 2022-03-08 DIAGNOSIS — E611 Iron deficiency: Secondary | ICD-10-CM

## 2022-03-08 LAB — IRON, TOTAL/TOTAL IRON BINDING CAP
%SAT: 10 % (calc) — ABNORMAL LOW (ref 16–45)
Iron: 46 ug/dL (ref 40–190)
TIBC: 453 mcg/dL (calc) — ABNORMAL HIGH (ref 250–450)

## 2022-03-08 LAB — CBC WITH DIFFERENTIAL/PLATELET
Absolute Monocytes: 627 cells/uL (ref 200–950)
Basophils Absolute: 33 cells/uL (ref 0–200)
Basophils Relative: 0.5 %
Eosinophils Absolute: 139 cells/uL (ref 15–500)
Eosinophils Relative: 2.1 %
HCT: 36.7 % (ref 35.0–45.0)
Hemoglobin: 12 g/dL (ref 11.7–15.5)
Lymphs Abs: 2013 cells/uL (ref 850–3900)
MCH: 27.6 pg (ref 27.0–33.0)
MCHC: 32.7 g/dL (ref 32.0–36.0)
MCV: 84.6 fL (ref 80.0–100.0)
MPV: 10.6 fL (ref 7.5–12.5)
Monocytes Relative: 9.5 %
Neutro Abs: 3788 cells/uL (ref 1500–7800)
Neutrophils Relative %: 57.4 %
Platelets: 308 10*3/uL (ref 140–400)
RBC: 4.34 10*6/uL (ref 3.80–5.10)
RDW: 12.4 % (ref 11.0–15.0)
Total Lymphocyte: 30.5 %
WBC: 6.6 10*3/uL (ref 3.8–10.8)

## 2022-03-08 LAB — FERRITIN: Ferritin: 4 ng/mL — ABNORMAL LOW (ref 16–232)

## 2022-03-08 MED ORDER — IRON (FERROUS SULFATE) 325 (65 FE) MG PO TABS: ORAL_TABLET | ORAL | 3 refills | Status: AC

## 2022-03-08 NOTE — Telephone Encounter (Signed)
Patient has been notified

## 2022-03-08 NOTE — Telephone Encounter (Signed)
Still has iron deficiency per recent labs. Sending in Iron sulfate 325 mg twice daily with a meal. Follow up in 3 months with OV, Fe/TIBC, CBC with diff ?MJB, MD ?

## 2022-05-06 IMAGING — DX DG CHEST 2V
2 series · 2 of 2 positions shown · non-contrast
Comparison: 06/21/2014

CLINICAL DATA: Cough for 2 weeks

EXAM:
CHEST - 2 VIEW

[chest pa]
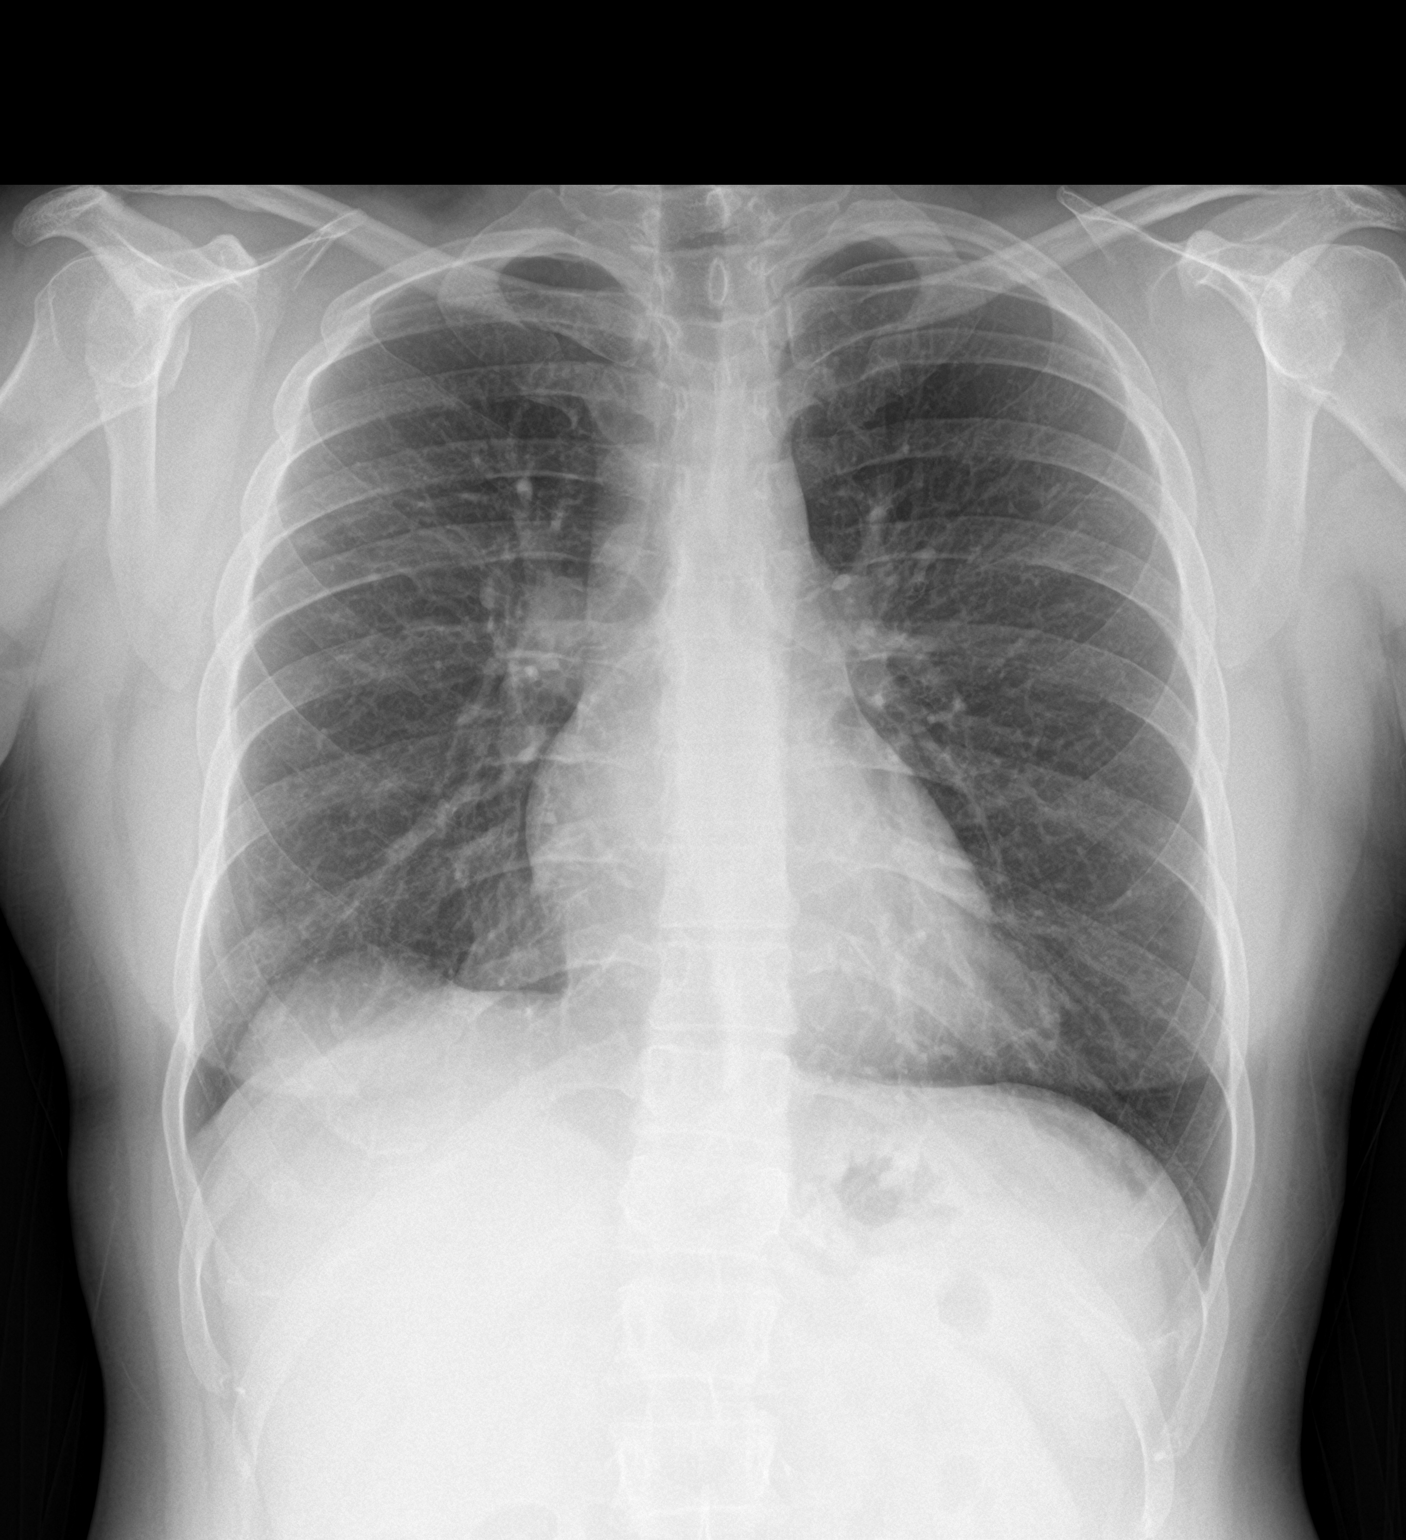

[chest lat]
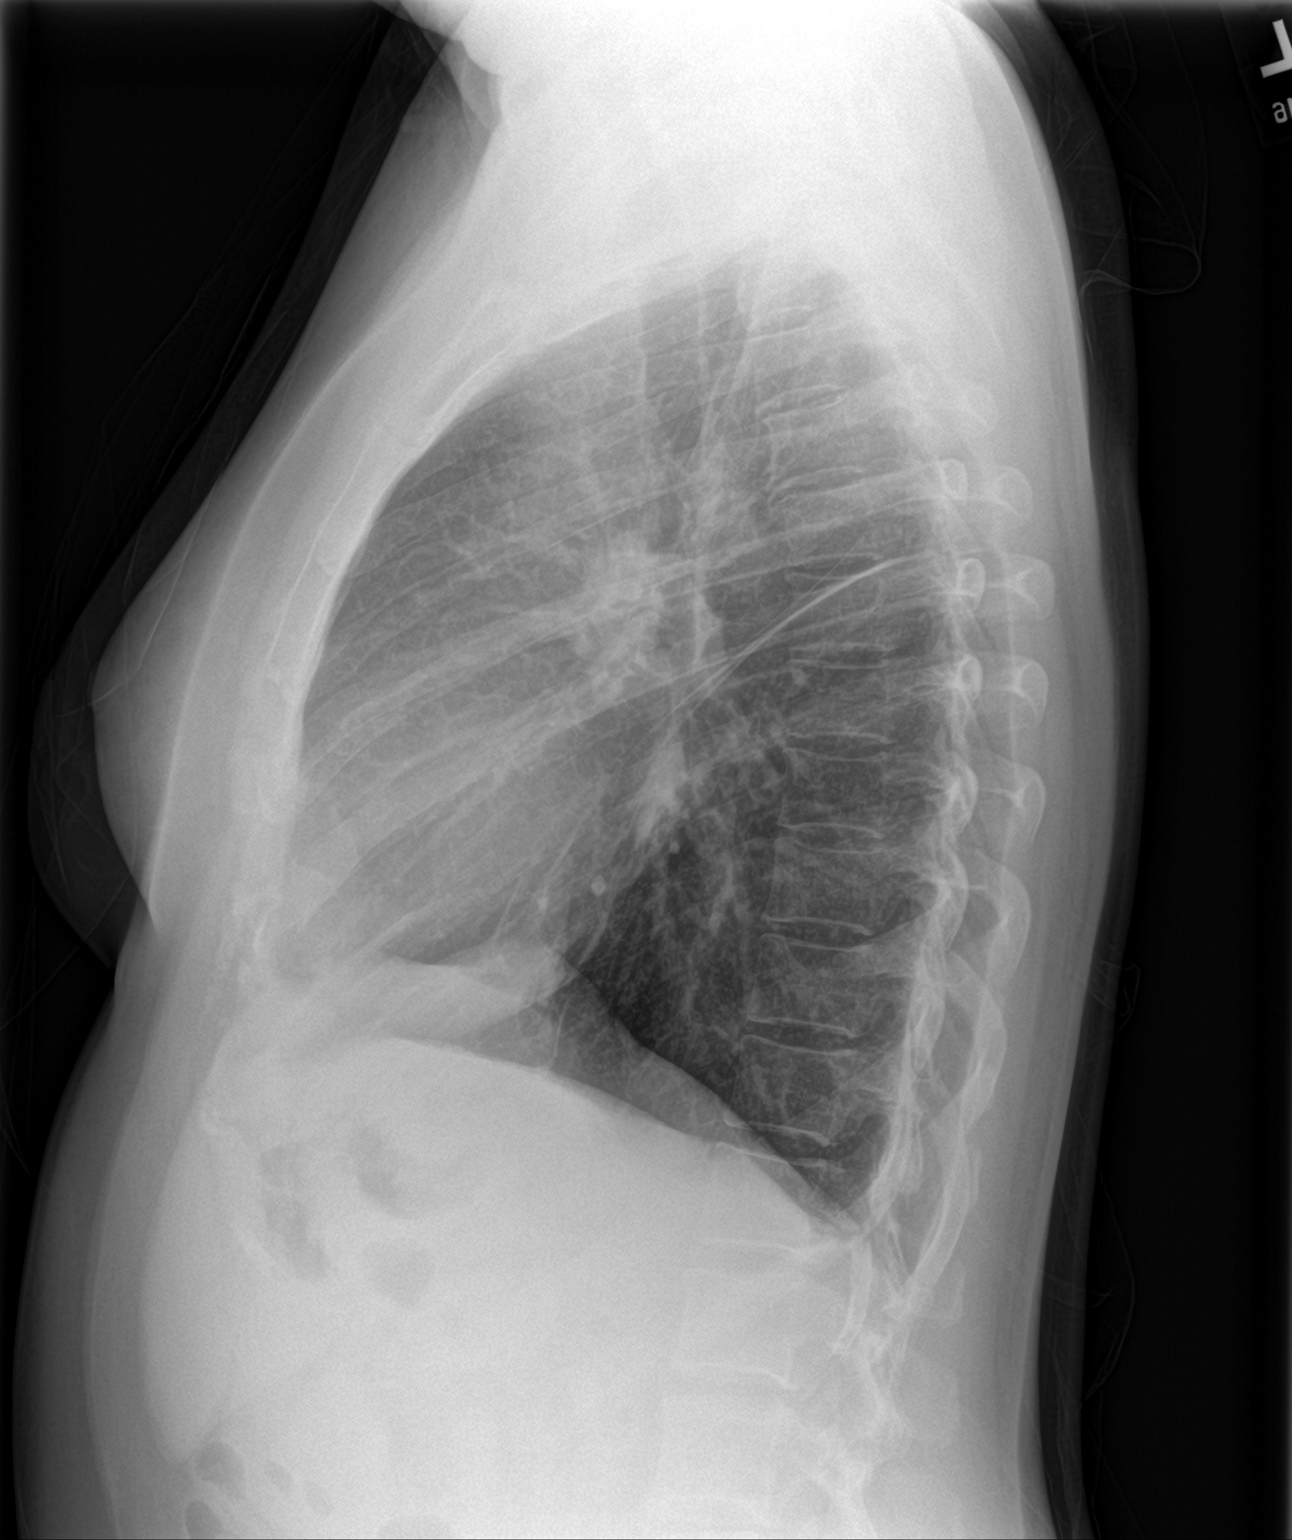

[2 of 2 positions shown; findings below may reference images not displayed]

FINDINGS: The heart size and mediastinal contours are within normal limits and
unchanged from prior. Both lungs are clear. The visualized skeletal
structures are unremarkable.
IMPRESSION: No active cardiopulmonary disease.

## 2022-05-15 DIAGNOSIS — D259 Leiomyoma of uterus, unspecified: Secondary | ICD-10-CM | POA: Diagnosis not present

## 2022-05-15 DIAGNOSIS — N920 Excessive and frequent menstruation with regular cycle: Secondary | ICD-10-CM | POA: Diagnosis not present

## 2022-06-03 DIAGNOSIS — L720 Epidermal cyst: Secondary | ICD-10-CM | POA: Diagnosis not present

## 2022-06-06 DIAGNOSIS — N920 Excessive and frequent menstruation with regular cycle: Secondary | ICD-10-CM | POA: Diagnosis not present

## 2022-06-10 ENCOUNTER — Other Ambulatory Visit: Payer: BC Managed Care – PPO

## 2022-06-13 ENCOUNTER — Ambulatory Visit: Payer: BC Managed Care – PPO | Admitting: Internal Medicine

## 2022-08-12 DIAGNOSIS — L720 Epidermal cyst: Secondary | ICD-10-CM | POA: Diagnosis not present

## 2022-09-13 DIAGNOSIS — Z1231 Encounter for screening mammogram for malignant neoplasm of breast: Secondary | ICD-10-CM | POA: Diagnosis not present

## 2022-09-13 DIAGNOSIS — Z124 Encounter for screening for malignant neoplasm of cervix: Secondary | ICD-10-CM | POA: Diagnosis not present

## 2022-09-13 DIAGNOSIS — Z6828 Body mass index (BMI) 28.0-28.9, adult: Secondary | ICD-10-CM | POA: Diagnosis not present

## 2022-09-13 DIAGNOSIS — Z01419 Encounter for gynecological examination (general) (routine) without abnormal findings: Secondary | ICD-10-CM | POA: Diagnosis not present

## 2022-10-04 ENCOUNTER — Telehealth: Payer: Self-pay | Admitting: Internal Medicine

## 2022-10-04 ENCOUNTER — Ambulatory Visit: Payer: BC Managed Care – PPO | Admitting: Internal Medicine

## 2022-10-04 ENCOUNTER — Encounter: Payer: Self-pay | Admitting: Internal Medicine

## 2022-10-04 VITALS — BP 108/80 | HR 96 | Temp 98.8°F

## 2022-10-04 DIAGNOSIS — H6503 Acute serous otitis media, bilateral: Secondary | ICD-10-CM | POA: Diagnosis not present

## 2022-10-04 DIAGNOSIS — J069 Acute upper respiratory infection, unspecified: Secondary | ICD-10-CM

## 2022-10-04 MED ORDER — AZITHROMYCIN 250 MG PO TABS: ORAL_TABLET | ORAL | 0 refills | Status: AC

## 2022-10-04 NOTE — Patient Instructions (Signed)
We are sorry you are not feeling well today.  Please take Zithromax Z-PAK 2 tabs day 1 followed by 1 tab days 2 through 5.  This medicine will stay in your system 10 days.  Rest and stay well-hydrated.  Call if not improved in 7 to 10 days or sooner if worse.

## 2022-10-04 NOTE — Telephone Encounter (Signed)
Gloria Howell [phone]  [name] called to say she has been fighting something since Thanksgiving, it started out with sore throat, and now Stuffy Head, Nasal Congestion, coughing, yellow mucus. I have ask her to do COVID test and call me back, to determine what kind of visit we will do.

## 2022-10-04 NOTE — Progress Notes (Signed)
   Subjective:    Patient ID: Gloria Howell, female    DOB: Aug 01, 1976, 46 y.o.   MRN: [id number]  HPI 46 year old Female seen today in person for respiratory congestion that has been ongoing since Thanksgiving.  She took a COVID test today that was negative.  Symptoms started as sore throat.  Now has nasal congestion, coughing and some discolored post nasal drainage.  Son also has a cough.   Her general health is excellent.    Review of Systems no fever, chills, nausea or vomiting.     Objective:   Physical Exam  Blood pressure 108/80 pulse 96, regular temperature 98.8 degrees pulse oximetry 99%  Skin: Warm and dry.  No cervical adenopathy.  Pharynx is clear.  TMs not red.  However TMs are full bilaterally neck supple.  Chest clear.    Assessment & Plan:   Acute upper respiratory infection  Bilateral serous otitis media  Plan: I am concerned that her symptoms could become a sinus infection rather quickly.  I am going to go ahead and prescribe Zithromax Z-PAK which usually works well for her and she is to take 2 tablets day 1 followed by 1 tablet days 2 through 5.  Rest at home.  Stay well-hydrated and call if symptoms not improving in a few days.

## 2022-10-04 NOTE — Telephone Encounter (Signed)
COVID Test negative

## 2022-11-29 ENCOUNTER — Telehealth: Payer: Self-pay | Admitting: Internal Medicine

## 2022-11-29 NOTE — Telephone Encounter (Signed)
Gloria Howell [phone]  [name] called to say when she got up this morning she lost her balance and fell hit her head on the wall, she did not loss consciousness, she does have a knot on her forehead. She says she still feels a little off, but that just may be because it shook her up or nerves, her mom took child to school.

## 2022-11-29 NOTE — Telephone Encounter (Signed)
Called patient back, she feels she is okay, just a little anxious, because she has never fallen like that before. She is just going to take it is easy, if she has any of those symptoms she will go to ED or UC.

## 2023-01-01 DIAGNOSIS — H35411 Lattice degeneration of retina, right eye: Secondary | ICD-10-CM | POA: Diagnosis not present

## 2023-01-20 DIAGNOSIS — U099 Post covid-19 condition, unspecified: Secondary | ICD-10-CM | POA: Diagnosis not present

## 2023-01-20 DIAGNOSIS — J069 Acute upper respiratory infection, unspecified: Secondary | ICD-10-CM | POA: Diagnosis not present

## 2023-03-26 DIAGNOSIS — R0981 Nasal congestion: Secondary | ICD-10-CM | POA: Diagnosis not present

## 2023-03-26 DIAGNOSIS — R051 Acute cough: Secondary | ICD-10-CM | POA: Diagnosis not present

## 2023-06-09 ENCOUNTER — Telehealth: Payer: Self-pay | Admitting: Internal Medicine

## 2023-06-09 ENCOUNTER — Telehealth: Payer: BC Managed Care – PPO | Admitting: Internal Medicine

## 2023-06-09 ENCOUNTER — Encounter: Payer: Self-pay | Admitting: Internal Medicine

## 2023-06-09 ENCOUNTER — Ambulatory Visit
Admission: RE | Admit: 2023-06-09 | Discharge: 2023-06-09 | Disposition: A | Discharge status: Home / Self Care | Payer: BC Managed Care – PPO | Source: Ambulatory Visit | Attending: Internal Medicine | Admitting: Internal Medicine

## 2023-06-09 VITALS — BP 131/89 | Temp 98.8°F

## 2023-06-09 DIAGNOSIS — F411 Generalized anxiety disorder: Secondary | ICD-10-CM | POA: Diagnosis not present

## 2023-06-09 DIAGNOSIS — R509 Fever, unspecified: Secondary | ICD-10-CM | POA: Diagnosis not present

## 2023-06-09 DIAGNOSIS — F439 Reaction to severe stress, unspecified: Secondary | ICD-10-CM

## 2023-06-09 DIAGNOSIS — J984 Other disorders of lung: Secondary | ICD-10-CM | POA: Diagnosis not present

## 2023-06-09 DIAGNOSIS — U071 COVID-19: Secondary | ICD-10-CM

## 2023-06-09 DIAGNOSIS — J22 Unspecified acute lower respiratory infection: Secondary | ICD-10-CM | POA: Diagnosis not present

## 2023-06-09 MED ORDER — AZITHROMYCIN 250 MG PO TABS: ORAL_TABLET | ORAL | 0 refills | Status: AC

## 2023-06-09 MED ORDER — HYDROCODONE BIT-HOMATROP MBR 5-1.5 MG/5ML PO SOLN: 5.0000 mL | Freq: Three times a day (TID) | ORAL | 0 refills | Status: AC | PRN
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
## 2023-06-09 NOTE — Telephone Encounter (Signed)
Gloria Howell [phone]  Gloria Howell called to say she has head and chest congestion, cough with yellow mucus, fatigue, body aches, winded at times. She thought this started when she mowed yard last weekend, but she really got sick Thursday and Friday she done COVID test and it was negative, I have ask her to do another one, since most symptoms have started over this weekend. I told her we could do a visit at 12:30, and the results of COVID test will determine what type of visit.

## 2023-06-25 ENCOUNTER — Encounter: Payer: Self-pay | Admitting: Internal Medicine

## 2023-06-25 NOTE — Patient Instructions (Signed)
CXR has been ordered and results are negative for pneumonia. Rest and stay well hydrated. Zithromax Z pak ordered. Take 2 tabs Day 1 and 1 tab days 2-5. Take Hycodan sparingly for cough.

## 2023-09-17 DIAGNOSIS — Z1231 Encounter for screening mammogram for malignant neoplasm of breast: Secondary | ICD-10-CM | POA: Diagnosis not present

## 2023-09-17 DIAGNOSIS — Z01419 Encounter for gynecological examination (general) (routine) without abnormal findings: Secondary | ICD-10-CM | POA: Diagnosis not present

## 2023-09-17 DIAGNOSIS — Z6827 Body mass index (BMI) 27.0-27.9, adult: Secondary | ICD-10-CM | POA: Diagnosis not present

## 2023-09-17 DIAGNOSIS — Z124 Encounter for screening for malignant neoplasm of cervix: Secondary | ICD-10-CM | POA: Diagnosis not present

## 2023-09-19 DIAGNOSIS — Z131 Encounter for screening for diabetes mellitus: Secondary | ICD-10-CM | POA: Diagnosis not present

## 2023-09-19 DIAGNOSIS — Z130 Encounter for screening for diseases of the blood and blood-forming organs and certain disorders involving the immune mechanism: Secondary | ICD-10-CM | POA: Diagnosis not present

## 2023-09-19 DIAGNOSIS — Z13228 Encounter for screening for other metabolic disorders: Secondary | ICD-10-CM | POA: Diagnosis not present

## 2023-09-19 DIAGNOSIS — Z13220 Encounter for screening for lipoid disorders: Secondary | ICD-10-CM | POA: Diagnosis not present

## 2023-09-19 DIAGNOSIS — Z1329 Encounter for screening for other suspected endocrine disorder: Secondary | ICD-10-CM | POA: Diagnosis not present

## 2023-12-22 DIAGNOSIS — R945 Abnormal results of liver function studies: Secondary | ICD-10-CM | POA: Diagnosis not present

## 2024-01-02 DIAGNOSIS — H35411 Lattice degeneration of retina, right eye: Secondary | ICD-10-CM | POA: Diagnosis not present

## 2024-09-30 DIAGNOSIS — Z01419 Encounter for gynecological examination (general) (routine) without abnormal findings: Secondary | ICD-10-CM | POA: Diagnosis not present

## 2024-09-30 DIAGNOSIS — Z1231 Encounter for screening mammogram for malignant neoplasm of breast: Secondary | ICD-10-CM | POA: Diagnosis not present

## 2024-09-30 DIAGNOSIS — Z6827 Body mass index (BMI) 27.0-27.9, adult: Secondary | ICD-10-CM | POA: Diagnosis not present

## 2024-09-30 DIAGNOSIS — Z124 Encounter for screening for malignant neoplasm of cervix: Secondary | ICD-10-CM | POA: Diagnosis not present
